# Patient Record
Sex: Male | Born: 1980 | Race: White | Hispanic: No | Marital: Married | Smoking: Never smoker
Health system: Southern US, Community
[De-identification: ages and names within clinical notes are randomized; demographics above are authoritative.]

## PROBLEM LIST (undated history)

## (undated) DIAGNOSIS — J45909 Unspecified asthma, uncomplicated: Secondary | ICD-10-CM

## (undated) DIAGNOSIS — E119 Type 2 diabetes mellitus without complications: Secondary | ICD-10-CM

## (undated) HISTORY — PX: KNEE SURGERY: SHX244

---

## 2001-07-21 ENCOUNTER — Ambulatory Visit (HOSPITAL_BASED_OUTPATIENT_CLINIC_OR_DEPARTMENT_OTHER): Admission: RE | Admit: 2001-07-21 | Discharge: 2001-07-21 | Payer: Self-pay | Admitting: Orthopedic Surgery

## 2002-10-10 ENCOUNTER — Ambulatory Visit (HOSPITAL_COMMUNITY): Admission: RE | Admit: 2002-10-10 | Discharge: 2002-10-10 | Payer: Self-pay | Admitting: *Deleted

## 2002-10-10 ENCOUNTER — Encounter: Payer: Self-pay | Admitting: *Deleted

## 2006-05-04 ENCOUNTER — Emergency Department (HOSPITAL_COMMUNITY): Admission: EM | Admit: 2006-05-04 | Discharge: 2006-05-04 | Payer: Self-pay | Admitting: Emergency Medicine

## 2012-10-17 ENCOUNTER — Encounter (HOSPITAL_COMMUNITY): Payer: Self-pay | Admitting: Emergency Medicine

## 2012-10-17 ENCOUNTER — Observation Stay (HOSPITAL_COMMUNITY)
Admission: EM | Admit: 2012-10-17 | Discharge: 2012-10-19 | Disposition: A | Discharge status: Home / Self Care | Payer: MEDICAID | Attending: Internal Medicine | Admitting: Internal Medicine

## 2012-10-17 ENCOUNTER — Emergency Department (HOSPITAL_COMMUNITY): Payer: Self-pay

## 2012-10-17 DIAGNOSIS — E860 Dehydration: Secondary | ICD-10-CM

## 2012-10-17 DIAGNOSIS — R112 Nausea with vomiting, unspecified: Secondary | ICD-10-CM | POA: Insufficient documentation

## 2012-10-17 DIAGNOSIS — J45909 Unspecified asthma, uncomplicated: Secondary | ICD-10-CM | POA: Insufficient documentation

## 2012-10-17 DIAGNOSIS — R7309 Other abnormal glucose: Secondary | ICD-10-CM

## 2012-10-17 DIAGNOSIS — E1110 Type 2 diabetes mellitus with ketoacidosis without coma: Secondary | ICD-10-CM

## 2012-10-17 DIAGNOSIS — E1100 Type 2 diabetes mellitus with hyperosmolarity without nonketotic hyperglycemic-hyperosmolar coma (NKHHC): Principal | ICD-10-CM | POA: Insufficient documentation

## 2012-10-17 DIAGNOSIS — R1310 Dysphagia, unspecified: Secondary | ICD-10-CM | POA: Insufficient documentation

## 2012-10-17 DIAGNOSIS — R739 Hyperglycemia, unspecified: Secondary | ICD-10-CM | POA: Diagnosis present

## 2012-10-17 DIAGNOSIS — E119 Type 2 diabetes mellitus without complications: Secondary | ICD-10-CM | POA: Diagnosis present

## 2012-10-17 DIAGNOSIS — K760 Fatty (change of) liver, not elsewhere classified: Secondary | ICD-10-CM | POA: Diagnosis present

## 2012-10-17 DIAGNOSIS — R109 Unspecified abdominal pain: Secondary | ICD-10-CM | POA: Diagnosis present

## 2012-10-17 DIAGNOSIS — K7689 Other specified diseases of liver: Secondary | ICD-10-CM | POA: Insufficient documentation

## 2012-10-17 HISTORY — DX: Unspecified asthma, uncomplicated: J45.909

## 2012-10-17 HISTORY — DX: Type 2 diabetes mellitus without complications: E11.9

## 2012-10-17 LAB — CBC WITH DIFFERENTIAL/PLATELET
Basophils Absolute: 0 K/uL (ref 0.0–0.1)
Basophils Relative: 0 % (ref 0–1)
Eosinophils Absolute: 0.2 10*3/uL (ref 0.0–0.7)
Eosinophils Relative: 2 % (ref 0–5)
HCT: 48.9 % (ref 39.0–52.0)
Hemoglobin: 18 g/dL — ABNORMAL HIGH (ref 13.0–17.0)
Lymphocytes Relative: 11 % — ABNORMAL LOW (ref 12–46)
Lymphs Abs: 1.1 10*3/uL (ref 0.7–4.0)
MCH: 32.6 pg (ref 26.0–34.0)
MCHC: 36.8 g/dL — ABNORMAL HIGH (ref 30.0–36.0)
MCV: 88.6 fL (ref 78.0–100.0)
Monocytes Absolute: 0.4 K/uL (ref 0.1–1.0)
Monocytes Relative: 4 % (ref 3–12)
Neutro Abs: 8.1 10*3/uL — ABNORMAL HIGH (ref 1.7–7.7)
Neutrophils Relative %: 82 % — ABNORMAL HIGH (ref 43–77)
Platelets: 176 10*3/uL (ref 150–400)
RBC: 5.52 MIL/uL (ref 4.22–5.81)
RDW: 12.7 % (ref 11.5–15.5)
WBC: 9.9 10*3/uL (ref 4.0–10.5)

## 2012-10-17 LAB — POCT I-STAT 3, VENOUS BLOOD GAS (G3P V)
Acid-base deficit: 5 mmol/L — ABNORMAL HIGH (ref 0.0–2.0)
Bicarbonate: 20.8 meq/L (ref 20.0–24.0)
O2 Saturation: 65 %
Patient temperature: 98
TCO2: 22 mmol/L (ref 0–100)
pCO2, Ven: 40 mmHg — ABNORMAL LOW (ref 45.0–50.0)
pH, Ven: 7.323 — ABNORMAL HIGH (ref 7.250–7.300)
pO2, Ven: 36 mmHg (ref 30.0–45.0)

## 2012-10-17 LAB — BASIC METABOLIC PANEL
CO2: 21 mEq/L (ref 19–32)
Chloride: 100 mEq/L (ref 96–112)
Glucose, Bld: 265 mg/dL — ABNORMAL HIGH (ref 70–99)
Potassium: 4 mEq/L (ref 3.5–5.1)
Sodium: 136 mEq/L (ref 135–145)

## 2012-10-17 LAB — URINALYSIS, ROUTINE W REFLEX MICROSCOPIC
Bilirubin Urine: NEGATIVE
Glucose, UA: >1000 mg/dL — AB
Hgb urine dipstick: NEGATIVE
Ketones, ur: >80 mg/dL — AB
Leukocytes, UA: NEGATIVE
Nitrite: NEGATIVE
Protein, ur: NEGATIVE mg/dL
Specific Gravity, Urine: 1.043 — ABNORMAL HIGH (ref 1.005–1.030)
Urobilinogen, UA: 0.2 mg/dL (ref 0.0–1.0)
pH: 5.5 (ref 5.0–8.0)

## 2012-10-17 LAB — COMPREHENSIVE METABOLIC PANEL
ALT: 79 U/L — ABNORMAL HIGH (ref 0–53)
AST: 35 U/L (ref 0–37)
Albumin: 4.5 g/dL (ref 3.5–5.2)
Alkaline Phosphatase: 145 U/L — ABNORMAL HIGH (ref 39–117)
BUN: 14 mg/dL (ref 6–23)
CO2: 21 mEq/L (ref 19–32)
Calcium: 10 mg/dL (ref 8.4–10.5)
Chloride: 94 mEq/L — ABNORMAL LOW (ref 96–112)
Creatinine, Ser: 0.77 mg/dL (ref 0.50–1.35)
GFR calc Af Amer: >90 mL/min (ref >90)
GFR calc non Af Amer: >90 mL/min (ref >90)
Glucose, Bld: 337 mg/dL — ABNORMAL HIGH (ref 70–99)
Potassium: 4.4 mEq/L (ref 3.5–5.1)
Sodium: 134 mEq/L — ABNORMAL LOW (ref 135–145)
Total Bilirubin: 1.1 mg/dL (ref 0.3–1.2)
Total Protein: 7.5 g/dL (ref 6.0–8.3)

## 2012-10-17 LAB — GLUCOSE, CAPILLARY
Glucose-Capillary: 281 mg/dL — ABNORMAL HIGH (ref 70–99)
Glucose-Capillary: 261 mg/dL — ABNORMAL HIGH (ref 70–99)

## 2012-10-17 LAB — LIPASE, BLOOD: Lipase: 23 U/L (ref 11–59)

## 2012-10-17 LAB — KETONES, QUALITATIVE: Acetone, Bld: NEGATIVE

## 2012-10-17 MED ORDER — SODIUM CHLORIDE 0.9 % IV BOLUS (SEPSIS)
1000.0000 mL | Freq: Once | INTRAVENOUS | Status: AC
  Administered 2012-10-17: 1000 mL via INTRAVENOUS

## 2012-10-17 MED ORDER — SODIUM CHLORIDE 0.9 % IV SOLN
INTRAVENOUS | Status: DC
  Administered 2012-10-17: 2 [IU]/h via INTRAVENOUS
  Filled 2012-10-17: qty 1

## 2012-10-17 MED ORDER — INSULIN ASPART 100 UNIT/ML ~~LOC~~ SOLN
0.0000 [IU] | Freq: Three times a day (TID) | SUBCUTANEOUS | Status: DC
Start: 2012-10-18 — End: 2012-10-17

## 2012-10-17 MED ORDER — SODIUM CHLORIDE 0.9 % IV SOLN: Freq: Once | INTRAVENOUS | Status: DC

## 2012-10-17 MED ORDER — MORPHINE SULFATE 4 MG/ML IJ SOLN
4.0000 mg | Freq: Once | INTRAMUSCULAR | Status: AC
  Administered 2012-10-17: 4 mg via INTRAVENOUS
  Filled 2012-10-17: qty 1

## 2012-10-17 MED ORDER — DEXTROSE-NACL 5-0.45 % IV SOLN: INTRAVENOUS | Status: DC

## 2012-10-17 MED ORDER — INSULIN REGULAR BOLUS VIA INFUSION
0.0000 [IU] | Freq: Three times a day (TID) | INTRAVENOUS | Status: DC
  Filled 2012-10-17: qty 10

## 2012-10-17 MED ORDER — SODIUM CHLORIDE 0.9 % IV SOLN: INTRAVENOUS | Status: DC

## 2012-10-17 MED ORDER — INSULIN GLARGINE 100 UNIT/ML ~~LOC~~ SOLN: 5.0000 [IU] | Freq: Once | SUBCUTANEOUS | Status: DC

## 2012-10-17 MED ORDER — DEXTROSE 50 % IV SOLN: 25.0000 mL | INTRAVENOUS | Status: DC | PRN

## 2012-10-17 MED ORDER — ONDANSETRON 4 MG PO TBDP
4.0000 mg | ORAL_TABLET | Freq: Once | ORAL | Status: AC
  Administered 2012-10-17: 4 mg via ORAL
  Filled 2012-10-17: qty 1

## 2012-10-17 MED ORDER — ONDANSETRON HCL 4 MG/2ML IJ SOLN
4.0000 mg | Freq: Once | INTRAMUSCULAR | Status: AC
  Administered 2012-10-17: 4 mg via INTRAVENOUS
  Filled 2012-10-17: qty 2

## 2012-10-17 MED ORDER — PROMETHAZINE HCL 25 MG/ML IJ SOLN
12.5000 mg | Freq: Once | INTRAMUSCULAR | Status: AC
  Administered 2012-10-17: 12.5 mg via INTRAVENOUS
  Filled 2012-10-17: qty 1

## 2012-10-17 MED ORDER — INSULIN ASPART 100 UNIT/ML ~~LOC~~ SOLN: 6.0000 [IU] | Freq: Once | SUBCUTANEOUS | Status: DC

## 2012-10-17 NOTE — ED Provider Notes (Signed)
History     CSN: [id number]  Arrival date & time 10/17/12  1639   First MD Initiated Contact with Patient 10/17/12 1850      Chief Complaint  Patient presents with  . Nausea  . Emesis  . Abdominal Pain    (Consider location/radiation/quality/duration/timing/severity/associated sxs/prior treatment) HPI Sean Anderson is a 31 y.o. male who presents with complaint of abdominal pain, nausea, persistent vomiting. States symptoms began this morning. Unable to keep anything down. Pain mainly in the upper abdomen but radiates all over. Denies diarrhea. Denies urinary symptoms. Hx of esophageal problems, but this does not feel like it. Denies recent illness, denies alcohol use, no medical problems. States chills, but did not measure temperature. Pain is described as sharp, radiating into the back. Nothing makes it better or worse. States vomited close to 80 times today, mainly yellow color.  Past Medical History  Diagnosis Date  . Asthma     Past Surgical History  Procedure Date  . Knee surgery     No family history on file.  History  Substance Use Topics  . Smoking status: Never Smoker   . Smokeless tobacco: Not on file  . Alcohol Use: Yes      Review of Systems  Constitutional: Positive for chills.  Gastrointestinal: Positive for nausea, vomiting and abdominal pain.  Genitourinary: Negative for dysuria, urgency and flank pain.  Musculoskeletal: Positive for back pain.  Skin: Negative.   Neurological: Positive for dizziness and weakness.  All other systems reviewed and are negative.    Allergies  Banana; Penicillins; and Pineapple  Home Medications  No current outpatient prescriptions on file.  BP 126/83  Pulse 106  Temp 98.9 F (37.2 C) (Oral)  Resp 18  SpO2 96%  Physical Exam  Nursing note and vitals reviewed. Constitutional: He is oriented to person, place, and time. He appears well-developed and well-nourished.       Uncomfortable appearing  HENT:    Head: Normocephalic.       Oral mucosa dry  Eyes: Conjunctivae normal are normal.  Neck: Neck supple.  Cardiovascular: Regular rhythm and normal heart sounds.        tachycardic  Pulmonary/Chest: Effort normal and breath sounds normal. No respiratory distress. He has no wheezes. He has no rales.  Abdominal: Soft. There is tenderness. There is guarding.       Tender diffusely, with most tenderness in RUQ, LUQ, epigastric area  Musculoskeletal: He exhibits no edema.  Neurological: He is alert and oriented to person, place, and time.  Skin: Skin is warm and dry.  Psychiatric: He has a normal mood and affect.    ED Course  Procedures (including critical care time)  Pt with nausea, vomiting, abdominal pain. Labs, fluids. Will monitor. Pain meds and anti emetics ordered.   Results for orders placed during the hospital encounter of 10/17/12  CBC WITH DIFFERENTIAL      Component Value Range   WBC 9.9  4.0 - 10.5 K/uL   RBC 5.52  4.22 - 5.81 MIL/uL   Hemoglobin 18.0 (*) 13.0 - 17.0 g/dL   HCT 40.9  81.1 - 91.4 %   MCV 88.6  78.0 - 100.0 fL   MCH 32.6  26.0 - 34.0 pg   MCHC 36.8 (*) 30.0 - 36.0 g/dL   RDW 78.2  95.6 - 21.3 %   Platelets 176  150 - 400 K/uL   Neutrophils Relative 82 (*) 43 - 77 %   Neutro Abs 8.1 (*)  1.7 - 7.7 K/uL   Lymphocytes Relative 11 (*) 12 - 46 %   Lymphs Abs 1.1  0.7 - 4.0 K/uL   Monocytes Relative 4  3 - 12 %   Monocytes Absolute 0.4  0.1 - 1.0 K/uL   Eosinophils Relative 2  0 - 5 %   Eosinophils Absolute 0.2  0.0 - 0.7 K/uL   Basophils Relative 0  0 - 1 %   Basophils Absolute 0.0  0.0 - 0.1 K/uL  COMPREHENSIVE METABOLIC PANEL      Component Value Range   Sodium 134 (*) 135 - 145 mEq/L   Potassium 4.4  3.5 - 5.1 mEq/L   Chloride 94 (*) 96 - 112 mEq/L   CO2 21  19 - 32 mEq/L   Glucose, Bld 337 (*) 70 - 99 mg/dL   BUN 14  6 - 23 mg/dL   Creatinine, Ser 1.61  0.50 - 1.35 mg/dL   Calcium 09.6  8.4 - 04.5 mg/dL   Total Protein 7.5  6.0 - 8.3 g/dL    Albumin 4.5  3.5 - 5.2 g/dL   AST 35  0 - 37 U/L   ALT 79 (*) 0 - 53 U/L   Alkaline Phosphatase 145 (*) 39 - 117 U/L   Total Bilirubin 1.1  0.3 - 1.2 mg/dL   GFR calc non Af Amer >90  >90 mL/min   GFR calc Af Amer >90  >90 mL/min  LIPASE, BLOOD      Component Value Range   Lipase 23  11 - 59 U/L  URINALYSIS, ROUTINE W REFLEX MICROSCOPIC      Component Value Range   Color, Urine YELLOW  YELLOW   APPearance CLEAR  CLEAR   Specific Gravity, Urine 1.043 (*) 1.005 - 1.030   pH 5.5  5.0 - 8.0   Glucose, UA >1000 (*) NEGATIVE mg/dL   Hgb urine dipstick NEGATIVE  NEGATIVE   Bilirubin Urine NEGATIVE  NEGATIVE   Ketones, ur >80 (*) NEGATIVE mg/dL   Protein, ur NEGATIVE  NEGATIVE mg/dL   Urobilinogen, UA 0.2  0.0 - 1.0 mg/dL   Nitrite NEGATIVE  NEGATIVE   Leukocytes, UA NEGATIVE  NEGATIVE  POCT I-STAT 3, BLOOD GAS (G3P V)      Component Value Range   pH, Ven 7.323 (*) 7.250 - 7.300   pCO2, Ven 40.0 (*) 45.0 - 50.0 mmHg   pO2, Ven 36.0  30.0 - 45.0 mmHg   Bicarbonate 20.8  20.0 - 24.0 mEq/L   TCO2 22  0 - 100 mmol/L   O2 Saturation 65.0     Acid-base deficit 5.0 (*) 0.0 - 2.0 mmol/L   Patient temperature 98.0 F     Sample type VENOUS     Comment NOTIFIED PHYSICIAN    GLUCOSE, CAPILLARY      Component Value Range   Glucose-Capillary 281 (*) 70 - 99 mg/dL  URINE MICROSCOPIC-ADD ON      Component Value Range   Squamous Epithelial / LPF RARE  RARE   WBC, UA 0-2  <3 WBC/hpf   RBC / HPF 0-2  <3 RBC/hpf   Bacteria, UA RARE  RARE  BASIC METABOLIC PANEL      Component Value Range   Sodium 136  135 - 145 mEq/L   Potassium 4.0  3.5 - 5.1 mEq/L   Chloride 100  96 - 112 mEq/L   CO2 21  19 - 32 mEq/L   Glucose, Bld 265 (*) 70 - 99  mg/dL   BUN 13  6 - 23 mg/dL   Creatinine, Ser 2.35  0.50 - 1.35 mg/dL   Calcium 8.3 (*) 8.4 - 10.5 mg/dL   GFR calc non Af Amer >90  >90 mL/min   GFR calc Af Amer >90  >90 mL/min  KETONES, QUALITATIVE      Component Value Range   Acetone, Bld NEGATIVE   NEGATIVE  GLUCOSE, CAPILLARY      Component Value Range   Glucose-Capillary 261 (*) 70 - 99 mg/dL  TROPONIN I      Component Value Range   Troponin I <0.30  <0.30 ng/mL  CK      Component Value Range   Total CK 25  7 - 232 U/L   Dg Abd Acute W/chest  10/17/2012  *RADIOLOGY REPORT*  Clinical Data: Abdominal pain, vomiting.  ACUTE ABDOMEN SERIES (ABDOMEN 2 VIEW & CHEST 1 VIEW)  Comparison: None.  Findings: Lungs are clear.  Cardiomediastinal contours are within normal range.  No free intraperitoneal air. The bowel gas pattern is non- obstructive. Organ outlines are normal where seen. No acute or aggressive osseous abnormality identified.  IMPRESSION: Nonobstructive bowel gas pattern.   Original Report Authenticated By: Jearld Lesch, M.D.     Filed Vitals:   10/18/12 0010  BP: 106/67  Pulse: 90  Temp: 98.5 F (36.9 C)  Resp: 20     1. DKA (diabetic ketoacidoses)   2. Dehydration   3. Nausea & vomiting   4. Abdominal pain   5. Hyperglycemia       MDM  Pt with new elevation on glucose, possible diabetes. Lipase negative. CBC unremarkable. Pt feels better with fluids, anti emetics. UA shows >1000 glucose. Anion gap 19, however pH elevated. Possible DKA? Pt still appears sick, will admit. Discussed with [facility], who will admit pt, will start on glucose stabilizer.   Filed Vitals:   10/18/12 0010  BP: 106/67  Pulse: 90  Temp: 98.5 F (36.9 C)  Resp: [address] Zeyna Mkrtchyan, [place] 10/18/12 0114

## 2012-10-17 NOTE — ED Notes (Signed)
Pt c/o upper abdominal pain with n/v onset today. Pt reports that he vomited 60-80 times today. Pt denies change in diet.

## 2012-10-17 NOTE — ED Provider Notes (Addendum)
Medical screening examination/treatment/procedure(s) were conducted as a shared visit with non-physician practitioner(s) and myself.  I personally evaluated the patient during the encounter   Pt with new onset DM, glucose over 300 initially, anion gap of 19, ketones in UA.  Pt does endorse polyuria, polydypsia ongoing. Numerous episodes of vomiting today.  Father reports pt's aunt has DM, but no other close relatives.  Pt likely in early DKA, will continue aggressive IVF's, IV insulin, will need admit to step down most likely.    CRITICAL CARE Performed by: Lear Ng   Total critical care time: 30 min  Critical care time was exclusive of separately billable procedures and treating other patients.  Critical care was necessary to treat or prevent imminent or life-threatening deterioration.  Critical care was time spent personally by me on the following activities: development of treatment plan with patient and/or surrogate as well as nursing, discussions with consultants, evaluation of patient's response to treatment, examination of patient, obtaining history from patient or surrogate, ordering and performing treatments and interventions, ordering and review of laboratory studies, ordering and review of radiographic studies, pulse oximetry and re-evaluation of patient's condition.    Impression: DKA, dehydration    Gavin Pound. Oletta Lamas, MD 10/17/12 2258  Gavin Pound. Oletta Lamas, MD 10/17/12 2259

## 2012-10-17 NOTE — ED Notes (Signed)
Patient reports having extreme thirst and frequent urination for the last few weeks. Pt states he is concerned about diabetes. Patient remains alertx4, NAD.

## 2012-10-17 NOTE — ED Notes (Signed)
Pt denies nausea.

## 2012-10-17 NOTE — ED Notes (Signed)
Please contact pt. father Davell at cell [phone]. Or wife Lora Havens at cell [phone] if any changes.

## 2012-10-18 ENCOUNTER — Observation Stay (HOSPITAL_COMMUNITY): Payer: Self-pay

## 2012-10-18 ENCOUNTER — Encounter (HOSPITAL_COMMUNITY): Payer: Self-pay | Admitting: Radiology

## 2012-10-18 LAB — RAPID URINE DRUG SCREEN, HOSP PERFORMED
Amphetamines: NOT DETECTED
Benzodiazepines: NOT DETECTED
Opiates: POSITIVE — AB

## 2012-10-18 LAB — CBC
MCH: 32.6 pg (ref 26.0–34.0)
MCHC: 36.1 g/dL — ABNORMAL HIGH (ref 30.0–36.0)
Platelets: 142 10*3/uL — ABNORMAL LOW (ref 150–400)

## 2012-10-18 LAB — BASIC METABOLIC PANEL
BUN: 13 mg/dL (ref 6–23)
Calcium: 8.2 mg/dL — ABNORMAL LOW (ref 8.4–10.5)
Creatinine, Ser: 0.68 mg/dL (ref 0.50–1.35)
GFR calc non Af Amer: >90 mL/min (ref >90)
Glucose, Bld: 239 mg/dL — ABNORMAL HIGH (ref 70–99)

## 2012-10-18 LAB — HEMOGLOBIN A1C
Hgb A1c MFr Bld: 12.4 % — ABNORMAL HIGH (ref <5.7)
Mean Plasma Glucose: 312 mg/dL — ABNORMAL HIGH (ref <117)
Mean Plasma Glucose: 309 mg/dL — ABNORMAL HIGH (ref <117)

## 2012-10-18 LAB — CBC WITH DIFFERENTIAL/PLATELET
Basophils Relative: 0 % (ref 0–1)
HCT: 41.8 % (ref 39.0–52.0)
Hemoglobin: 14.7 g/dL (ref 13.0–17.0)
Lymphs Abs: 1.5 10*3/uL (ref 0.7–4.0)
MCH: 31.8 pg (ref 26.0–34.0)
MCHC: 35.2 g/dL (ref 30.0–36.0)
Monocytes Absolute: 0.4 10*3/uL (ref 0.1–1.0)
Monocytes Relative: 8 % (ref 3–12)
Neutro Abs: 3.4 10*3/uL (ref 1.7–7.7)
RBC: 4.62 MIL/uL (ref 4.22–5.81)

## 2012-10-18 LAB — COMPREHENSIVE METABOLIC PANEL
ALT: 51 U/L (ref 0–53)
AST: 20 U/L (ref 0–37)
Calcium: 8.4 mg/dL (ref 8.4–10.5)
GFR calc Af Amer: >90 mL/min (ref >90)
Sodium: 139 mEq/L (ref 135–145)
Total Protein: 6 g/dL (ref 6.0–8.3)

## 2012-10-18 LAB — GLUCOSE, CAPILLARY
Glucose-Capillary: 240 mg/dL — ABNORMAL HIGH (ref 70–99)
Glucose-Capillary: 207 mg/dL — ABNORMAL HIGH (ref 70–99)
Glucose-Capillary: 147 mg/dL — ABNORMAL HIGH (ref 70–99)
Glucose-Capillary: 129 mg/dL — ABNORMAL HIGH (ref 70–99)
Glucose-Capillary: 121 mg/dL — ABNORMAL HIGH (ref 70–99)
Glucose-Capillary: 174 mg/dL — ABNORMAL HIGH (ref 70–99)
Glucose-Capillary: 156 mg/dL — ABNORMAL HIGH (ref 70–99)
Glucose-Capillary: 158 mg/dL — ABNORMAL HIGH (ref 70–99)
Glucose-Capillary: 271 mg/dL — ABNORMAL HIGH (ref 70–99)
Glucose-Capillary: 209 mg/dL — ABNORMAL HIGH (ref 70–99)

## 2012-10-18 LAB — TROPONIN I
Troponin I: <0.3 ng/mL (ref <0.30)
Troponin I: <0.3 ng/mL (ref <0.30)

## 2012-10-18 MED ORDER — ACETAMINOPHEN 325 MG PO TABS
650.0000 mg | ORAL_TABLET | Freq: Four times a day (QID) | ORAL | Status: DC | PRN
  Administered 2012-10-18: 650 mg via ORAL
  Filled 2012-10-18: qty 2

## 2012-10-18 MED ORDER — ONDANSETRON HCL 4 MG/2ML IJ SOLN: 4.0000 mg | Freq: Four times a day (QID) | INTRAMUSCULAR | Status: DC | PRN

## 2012-10-18 MED ORDER — SODIUM CHLORIDE 0.9 % IV SOLN: INTRAVENOUS | Status: DC

## 2012-10-18 MED ORDER — IOHEXOL 300 MG/ML  SOLN
100.0000 mL | Freq: Once | INTRAMUSCULAR | Status: AC | PRN
  Administered 2012-10-18: 100 mL via INTRAVENOUS

## 2012-10-18 MED ORDER — PANTOPRAZOLE SODIUM 40 MG IV SOLR
40.0000 mg | INTRAVENOUS | Status: DC
  Administered 2012-10-18: 40 mg via INTRAVENOUS
  Filled 2012-10-18 (×2): qty 40

## 2012-10-18 MED ORDER — PANTOPRAZOLE SODIUM 40 MG PO TBEC
40.0000 mg | DELAYED_RELEASE_TABLET | Freq: Every day | ORAL | Status: DC
  Administered 2012-10-18: 40 mg via ORAL
  Filled 2012-10-18: qty 1

## 2012-10-18 MED ORDER — SODIUM CHLORIDE 0.9 % IJ SOLN: 3.0000 mL | Freq: Two times a day (BID) | INTRAMUSCULAR | Status: DC

## 2012-10-18 MED ORDER — INSULIN GLARGINE 100 UNIT/ML ~~LOC~~ SOLN
10.0000 [IU] | Freq: Once | SUBCUTANEOUS | Status: AC
  Administered 2012-10-18: 10 [IU] via SUBCUTANEOUS

## 2012-10-18 MED ORDER — ONDANSETRON HCL 4 MG PO TABS: 4.0000 mg | ORAL_TABLET | Freq: Four times a day (QID) | ORAL | Status: DC | PRN

## 2012-10-18 MED ORDER — DEXTROSE 50 % IV SOLN: 25.0000 mL | INTRAVENOUS | Status: DC | PRN

## 2012-10-18 MED ORDER — INSULIN ASPART PROT & ASPART (70-30 MIX) 100 UNIT/ML ~~LOC~~ SUSP
25.0000 [IU] | Freq: Two times a day (BID) | SUBCUTANEOUS | Status: DC
  Administered 2012-10-18 – 2012-10-19 (×2): 25 [IU] via SUBCUTANEOUS
  Filled 2012-10-18: qty 3

## 2012-10-18 MED ORDER — POTASSIUM CHLORIDE 10 MEQ/100ML IV SOLN
10.0000 meq | INTRAVENOUS | Status: AC
  Administered 2012-10-18 (×3): 10 meq via INTRAVENOUS
  Filled 2012-10-18 (×3): qty 100

## 2012-10-18 MED ORDER — IOHEXOL 300 MG/ML  SOLN
20.0000 mL | INTRAMUSCULAR | Status: AC
  Administered 2012-10-18 (×2): 20 mL via ORAL

## 2012-10-18 MED ORDER — SODIUM CHLORIDE 0.9 % IV SOLN
INTRAVENOUS | Status: DC
  Administered 2012-10-18 – 2012-10-19 (×3): via INTRAVENOUS
  Filled 2012-10-18 (×3): qty 1000

## 2012-10-18 MED ORDER — LIVING WELL WITH DIABETES BOOK
Freq: Once | Status: AC
  Administered 2012-10-18: 13:00:00
  Filled 2012-10-18 (×3): qty 1

## 2012-10-18 MED ORDER — SODIUM CHLORIDE 0.9 % IV SOLN
INTRAVENOUS | Status: DC
  Administered 2012-10-18: 10:00:00 via INTRAVENOUS
  Filled 2012-10-18 (×2): qty 1000

## 2012-10-18 MED ORDER — POTASSIUM CHLORIDE 10 MEQ/100ML IV SOLN: 10.0000 meq | INTRAVENOUS | Status: DC

## 2012-10-18 MED ORDER — ONDANSETRON HCL 4 MG/2ML IJ SOLN: 4.0000 mg | Freq: Three times a day (TID) | INTRAMUSCULAR | Status: DC | PRN

## 2012-10-18 MED ORDER — INSULIN ASPART 100 UNIT/ML ~~LOC~~ SOLN
0.0000 [IU] | Freq: Three times a day (TID) | SUBCUTANEOUS | Status: DC
  Administered 2012-10-18: 8 [IU] via SUBCUTANEOUS

## 2012-10-18 MED ORDER — SODIUM CHLORIDE 0.9 % IV SOLN
INTRAVENOUS | Status: DC
  Filled 2012-10-18 (×2): qty 1000

## 2012-10-18 MED ORDER — DEXTROSE-NACL 5-0.45 % IV SOLN
INTRAVENOUS | Status: DC
  Administered 2012-10-18: via INTRAVENOUS

## 2012-10-18 MED ORDER — SODIUM CHLORIDE 0.9 % IV SOLN
INTRAVENOUS | Status: AC
  Filled 2012-10-18: qty 1

## 2012-10-18 MED ORDER — ACETAMINOPHEN 650 MG RE SUPP: 650.0000 mg | Freq: Four times a day (QID) | RECTAL | Status: DC | PRN

## 2012-10-18 NOTE — Progress Notes (Signed)
Inpatient Diabetes Program Recommendations  AACE/ADA: New Consensus Statement on Inpatient Glycemic Control (2013)  Target Ranges:  Prepandial:   less than 140 mg/dL      Peak postprandial:   less than 180 mg/dL (1-2 hours)      Critically ill patients:  140 - 180 mg/dL   Reason for Visit: Consult for new onset of diabetes and medication recommendation.  Note: Spoke with pt about new diagnosis. Discussed what an A1C is, basic pathophysiology of DM Type 2, basic home care, meal planning, importance of checking CBGs and maintaining good CBG control to prevent long-term and short-term complications. Reviewed signs and symptoms of hyperglycemia and hypoglycemia and treatment of both. Patient states that he will watch the videos when his wife comes this afternoon.  Patient does not have any insurance nor a primary care physician.  Since he lives in [place] I provided him with [facility] phone number  [phone]) and address.  He can be seen at the [facility] since he is uninsured and they will assist him with medication needs also once he is seen. Provided information on the glucose meter at [employer] ([employer]) which is more affordable than some other meters.   RNs to provide ongoing basic DM education at bedside with this patient. Have ordered educational booklet, insulin starter kit, and DM videos.    Patient's A1C is 12.5%.  Therefore, recommend that he goes home on Novolin/Relion 70/30 25 units BID.   Will continue to follow. Thanks, Orlando Penner, RN, BSN, CCRN Diabetes Coordinator Inpatient Diabetes Program [phone]

## 2012-10-18 NOTE — Progress Notes (Signed)
Nutrition Brief Note  Patient identified on the Malnutrition Screening Tool (MST) Report. Pt unable to state usual weight. Endorses slight weight loss but unable to state how much or time frame. Pt's intake at baseline is minimal per his report.  Body mass index is 26.67 kg/(m^2). Pt meets criteria for weight is WNL based on current BMI.   Current diet order is Carbohydrate Modified Medium; meal intake is not recorded at this time. Pt reports intake is improving. Labs and medications reviewed.   No nutrition interventions warranted at this time. If nutrition issues arise, please consult RD.   Jarold Motto MS, RD, LDN Pager: [phone] After-hours pager: [phone]

## 2012-10-18 NOTE — H&P (Signed)
Sean Anderson is an 31 y.o. male.   Patient was seen and examined on October 17, 2012. PCP - none. Chief Complaint: Nausea vomiting abdominal pain. HPI: 31 year old male with no significant past medical history has been experiencing some polyuria and polydipsia for last one month. Since today morning patient has been having multiple episodes of nausea vomiting and abdominal discomfort. Patient's abdominal discomfort has been diffuse. Since he was unable to keep anything in his vomiting was persistent he came to the ER. In the ER patient was found to have hyperglycemia with mildly elevated anion gap. Patient has been admitted for further management. Acute abdominal series was unremarkable. Patient states that he's having having some retrosternal heartburn off and on for last few months. Denies any exertional symptoms or shortness of breath.  Past Medical History  Diagnosis Date  . Asthma     Past Surgical History  Procedure Date  . Knee surgery     Family History  Problem Relation Age of Onset  . Colon cancer Other    Social History:  reports that he has never smoked. He does not have any smokeless tobacco history on file. He reports that he drinks alcohol. He reports that he does not use illicit drugs.  Allergies:  Allergies  Allergen Reactions  . Banana   . Penicillins Hives  . Pineapple      (Not in a hospital admission)  Results for orders placed during the hospital encounter of 10/17/12 (from the past 48 hour(s))  CBC WITH DIFFERENTIAL     Status: Abnormal   Collection Time   10/17/12  5:40 PM      Component Value Range Comment   WBC 9.9  4.0 - 10.5 K/uL    RBC 5.52  4.22 - 5.81 MIL/uL    Hemoglobin 18.0 (*) 13.0 - 17.0 g/dL    HCT 04.5  40.9 - 81.1 %    MCV 88.6  78.0 - 100.0 fL    MCH 32.6  26.0 - 34.0 pg    MCHC 36.8 (*) 30.0 - 36.0 g/dL    RDW 91.4  78.2 - 95.6 %    Platelets 176  150 - 400 K/uL    Neutrophils Relative 82 (*) 43 - 77 %    Neutro Abs 8.1 (*) 1.7  - 7.7 K/uL    Lymphocytes Relative 11 (*) 12 - 46 %    Lymphs Abs 1.1  0.7 - 4.0 K/uL    Monocytes Relative 4  3 - 12 %    Monocytes Absolute 0.4  0.1 - 1.0 K/uL    Eosinophils Relative 2  0 - 5 %    Eosinophils Absolute 0.2  0.0 - 0.7 K/uL    Basophils Relative 0  0 - 1 %    Basophils Absolute 0.0  0.0 - 0.1 K/uL   COMPREHENSIVE METABOLIC PANEL     Status: Abnormal   Collection Time   10/17/12  5:40 PM      Component Value Range Comment   Sodium 134 (*) 135 - 145 mEq/L    Potassium 4.4  3.5 - 5.1 mEq/L    Chloride 94 (*) 96 - 112 mEq/L    CO2 21  19 - 32 mEq/L    Glucose, Bld 337 (*) 70 - 99 mg/dL    BUN 14  6 - 23 mg/dL    Creatinine, Ser 2.13  0.50 - 1.35 mg/dL    Calcium 08.6  8.4 - 10.5 mg/dL  Total Protein 7.5  6.0 - 8.3 g/dL    Albumin 4.5  3.5 - 5.2 g/dL    AST 35  0 - 37 U/L    ALT 79 (*) 0 - 53 U/L    Alkaline Phosphatase 145 (*) 39 - 117 U/L    Total Bilirubin 1.1  0.3 - 1.2 mg/dL    GFR calc non Af Amer >90  >90 mL/min    GFR calc Af Amer >90  >90 mL/min   LIPASE, BLOOD     Status: Normal   Collection Time   10/17/12  5:40 PM      Component Value Range Comment   Lipase 23  11 - 59 U/L   POCT I-STAT 3, BLOOD GAS (G3P V)     Status: Abnormal   Collection Time   10/17/12  8:44 PM      Component Value Range Comment   pH, Ven 7.323 (*) 7.250 - 7.300    pCO2, Ven 40.0 (*) 45.0 - 50.0 mmHg    pO2, Ven 36.0  30.0 - 45.0 mmHg    Bicarbonate 20.8  20.0 - 24.0 mEq/L    TCO2 22  0 - 100 mmol/L    O2 Saturation 65.0      Acid-base deficit 5.0 (*) 0.0 - 2.0 mmol/L    Patient temperature 98.0 F      Sample type VENOUS      Comment NOTIFIED PHYSICIAN     URINALYSIS, ROUTINE W REFLEX MICROSCOPIC     Status: Abnormal   Collection Time   10/17/12  9:32 PM      Component Value Range Comment   Color, Urine YELLOW  YELLOW    APPearance CLEAR  CLEAR    Specific Gravity, Urine 1.043 (*) 1.005 - 1.030    pH 5.5  5.0 - 8.0    Glucose, UA >1000 (*) NEGATIVE mg/dL    Hgb  urine dipstick NEGATIVE  NEGATIVE    Bilirubin Urine NEGATIVE  NEGATIVE    Ketones, ur >80 (*) NEGATIVE mg/dL    Protein, ur NEGATIVE  NEGATIVE mg/dL    Urobilinogen, UA 0.2  0.0 - 1.0 mg/dL    Nitrite NEGATIVE  NEGATIVE    Leukocytes, UA NEGATIVE  NEGATIVE   URINE MICROSCOPIC-ADD ON     Status: Normal   Collection Time   10/17/12  9:32 PM      Component Value Range Comment   Squamous Epithelial / LPF RARE  RARE    WBC, UA 0-2  <3 WBC/hpf    RBC / HPF 0-2  <3 RBC/hpf    Bacteria, UA RARE  RARE   GLUCOSE, CAPILLARY     Status: Abnormal   Collection Time   10/17/12  9:35 PM      Component Value Range Comment   Glucose-Capillary 281 (*) 70 - 99 mg/dL   BASIC METABOLIC PANEL     Status: Abnormal   Collection Time   10/17/12 10:49 PM      Component Value Range Comment   Sodium 136  135 - 145 mEq/L    Potassium 4.0  3.5 - 5.1 mEq/L    Chloride 100  96 - 112 mEq/L    CO2 21  19 - 32 mEq/L    Glucose, Bld 265 (*) 70 - 99 mg/dL    BUN 13  6 - 23 mg/dL    Creatinine, Ser 1.61  0.50 - 1.35 mg/dL    Calcium 8.3 (*) 8.4 - 10.5  mg/dL    GFR calc non Af Amer >90  >90 mL/min    GFR calc Af Amer >90  >90 mL/min   KETONES, QUALITATIVE     Status: Normal   Collection Time   10/17/12 10:49 PM      Component Value Range Comment   Acetone, Bld NEGATIVE  NEGATIVE   GLUCOSE, CAPILLARY     Status: Abnormal   Collection Time   10/17/12 11:05 PM      Component Value Range Comment   Glucose-Capillary 261 (*) 70 - 99 mg/dL   TROPONIN I     Status: Normal   Collection Time   10/17/12 11:06 PM      Component Value Range Comment   Troponin I <0.30  <0.30 ng/mL   CK     Status: Normal   Collection Time   10/17/12 11:06 PM      Component Value Range Comment   Total CK 25  7 - 232 U/L    Dg Abd Acute W/chest  10/17/2012  *RADIOLOGY REPORT*  Clinical Data: Abdominal pain, vomiting.  ACUTE ABDOMEN SERIES (ABDOMEN 2 VIEW & CHEST 1 VIEW)  Comparison: None.  Findings: Lungs are clear.   Cardiomediastinal contours are within normal range.  No free intraperitoneal air. The bowel gas pattern is non- obstructive. Organ outlines are normal where seen. No acute or aggressive osseous abnormality identified.  IMPRESSION: Nonobstructive bowel gas pattern.   Original Report Authenticated By: Jearld Lesch, M.D.     Review of Systems  Constitutional: Negative.   HENT: Negative.   Eyes: Negative.   Respiratory: Negative.   Cardiovascular: Negative.   Gastrointestinal: Positive for nausea, vomiting and abdominal pain.  Genitourinary: Negative.   Musculoskeletal: Negative.   Skin: Negative.   Neurological: Negative.   Endo/Heme/Allergies: Negative.   Psychiatric/Behavioral: Negative.     Blood pressure 101/82, pulse 96, temperature 98.4 F (36.9 C), temperature source Oral, resp. rate 18, SpO2 96.00%. Physical Exam  Constitutional: He is oriented to person, place, and time. He appears well-developed and well-nourished. No distress.  HENT:  Head: Normocephalic and atraumatic.  Right Ear: External ear normal.  Left Ear: External ear normal.  Nose: Nose normal.  Mouth/Throat: Oropharynx is clear and moist. No oropharyngeal exudate.  Eyes: Conjunctivae normal are normal. Pupils are equal, round, and reactive to light. Right eye exhibits no discharge. Left eye exhibits no discharge. No scleral icterus.  Neck: Normal range of motion. Neck supple.  Cardiovascular: Normal rate and regular rhythm.   Respiratory: Effort normal and breath sounds normal. No respiratory distress. He has no wheezes. He has no rales.  GI: Soft. Bowel sounds are normal. He exhibits no distension. There is no tenderness. There is no rebound.  Musculoskeletal: He exhibits no edema and no tenderness.  Neurological: He is alert and oriented to person, place, and time.       Moves all extremities.  Skin: Skin is warm and dry. He is not diaphoretic.     Assessment/Plan #1. Hyperglycemia with mildly elevated  anion gap - patient probably has early DKA. Due to the patient's mild and Patient was started on DKA protocol with IV insulin which we will continue until his anion gap is corrected after which transitioned to long-acting subcutaneous insulin. Check hemoglobin A1c and continue aggressive hydration. #2. Nausea vomiting abdominal pain - most likely from #1 reason. But since the pain is a persistent but since pain is still persistent we'll get a CT abdomen and pelvis. #3.  Heartburn and dysphagia - patient states he's been having heartburn for last few months with occasional difficulties of swallowing. Keep patient on Protonix. Patient otherwise he will need GI followup. Check the EKG and cardiac enzymes. CODE STATUS - full code.  Corneluis Allston N. 10/18/2012, 12:00 AM

## 2012-10-18 NOTE — Plan of Care (Signed)
Problem: Food- and Nutrition-Related Knowledge Deficit (NB-1.1) Goal: Nutrition education Formal process to instruct or train a patient/client in a skill or to impart knowledge to help patients/clients voluntarily manage or modify food choices and eating behavior to maintain or improve health.  Outcome: Completed/Met Date Met:  10/18/12  RD consulted for nutrition education regarding diabetes.     Lab Results  Component Value Date    HGBA1C 12.4* 10/18/2012    RD provided "Carbohydrate Counting for People with Diabetes" handout from the Academy of [employer] Dietetics. Discussed different food groups and their effects on blood sugar, emphasizing carbohydrate-containing foods. Provided list of carbohydrates and recommended serving sizes of common foods.  Discussed importance of controlled and consistent carbohydrate intake throughout the day. Provided examples of ways to balance meals/snacks and encouraged intake of high-fiber, whole grain complex carbohydrates.  Expect poor to fair compliance.  Current diet order is Carbohydrate Modified Medium. Labs and medications reviewed. No further nutrition interventions warranted at this time. RD contact information provided. If additional nutrition issues arise, please re-consult RD.  Jarold Motto MS, RD, LDN Pager: [phone] After-hours pager: [phone]

## 2012-10-18 NOTE — Progress Notes (Signed)
[facility] PROGRESS NOTE  Sean Anderson NFA:[id number] DOB: 08-28-1981 DOA: 10/17/2012 PCP: Sheila Oats, MD  Assessment/Plan:  #1. Hyperglycemia.  Anion gap closed.  Transitioning off insulin drip onto Lantus and Novolog today.  Need plan for outpatient medications and care (no pcp).  Diabetic education started.  Diabetic coordinator and nutrition consults requested.  #2. Nausea vomiting abdominal pain - most likely from #1 reason.  Nausea / vomiting improved this morning.  Starting carb modified diet.  CT abdomen pelvis pending.  #3. Heartburn and dysphagia - patient states he's been having heartburn for last few months with occasional difficulties of swallowing. Keep patient on Protonix. Patient otherwise he will need GI followup.    Code Status: full Family Communication:  Disposition Plan: To home 10/19/2013  HPI/Subjective: Patient feeling improved.  No further vomiting overnight.    Objective: Filed Vitals:   10/17/12 2013 10/18/12 0010 10/18/12 0211 10/18/12 0530  BP: 101/82 106/67 100/58 99/59  Pulse: 96 90 76 86  Temp: 98.4 F (36.9 C) 98.5 F (36.9 C) 97.8 F (36.6 C) 99.2 F (37.3 C)  TempSrc:  Oral Oral Oral  Resp:  20 18 20   Height:  5\' 10"  (1.778 m)    Weight:  84.3 kg (185 lb 13.6 oz)    SpO2: 96% 97% 95% 93%    Intake/Output Summary (Last 24 hours) at 10/18/12 1026 Last data filed at 10/18/12 0705  Gross per 24 hour  Intake 1082.3 ml  Output    400 ml  Net  682.3 ml   Filed Weights   10/18/12 0010  Weight: 84.3 kg (185 lb 13.6 oz)    Exam:   General:  A&O non toxic  Cardiovascular: Regular rate and rhythm, no murmurs rubs or gallops.  Respiratory: CTA no W/C/R  Abdomen: Soft , NT, ND, +BS  Extremities:  No LEE.  Psych:  A&O, NAD, Cooperative, Well Groomed.  Data Reviewed: Basic Metabolic Panel:  Lab 10/18/12 4696 10/18/12 0148 10/17/12 2249 10/17/12 1740  NA 139 139 136 134*  K 3.6 3.2* 4.0 4.4  CL 105 103 100 94*    CO2 24 23 21 21   GLUCOSE 136* 239* 265* 337*  BUN 12 13 13 14   CREATININE 0.71 0.68 0.67 0.77  CALCIUM 8.4 8.2* 8.3* 10.0  MG -- -- -- --  PHOS -- -- -- --   Liver Function Tests:  Lab 10/18/12 0712 10/17/12 1740  AST 20 35  ALT 51 79*  ALKPHOS 103 145*  BILITOT 0.8 1.1  PROT 6.0 7.5  ALBUMIN 3.4* 4.5    Lab 10/17/12 1740  LIPASE 23  AMYLASE --   CBC:  Lab 10/18/12 0712 10/18/12 0147 10/17/12 1740  WBC 5.5 6.1 9.9  NEUTROABS 3.4 -- 8.1*  HGB 14.7 14.7 18.0*  HCT 41.8 40.7 48.9  MCV 90.5 90.2 88.6  PLT 152 142* 176   Cardiac Enzymes:  Lab 10/18/12 0712 10/18/12 0154 10/17/12 2306  CKTOTAL -- -- 25  CKMB -- -- --  CKMBINDEX -- -- --  TROPONINI <0.30 <0.30 <0.30   CBG:  Lab 10/18/12 1006 10/18/12 0907 10/18/12 0806 10/18/12 0655 10/18/12 0608  GLUCAP 156* 174* 158* 121* 129*      Studies: Dg Abd Acute W/chest  10/17/2012  *RADIOLOGY REPORT*  Clinical Data: Abdominal pain, vomiting.  ACUTE ABDOMEN SERIES (ABDOMEN 2 VIEW & CHEST 1 VIEW)  Comparison: None.  Findings: Lungs are clear.  Cardiomediastinal contours are within normal range.  No free intraperitoneal air. The bowel gas  pattern is non- obstructive. Organ outlines are normal where seen. No acute or aggressive osseous abnormality identified.  IMPRESSION: Nonobstructive bowel gas pattern.   Original Report Authenticated By: Jearld Lesch, M.D.     Scheduled Meds:   . insulin aspart  0-15 Units Subcutaneous TID WC  . [COMPLETED] insulin glargine  10 Units Subcutaneous Once  . [COMPLETED] iohexol  20 mL Oral Q1 Hr x 2  . [COMPLETED] morphine  4 mg Intravenous Once  . [COMPLETED] ondansetron  4 mg Intravenous Once  . [COMPLETED] ondansetron  4 mg Oral Once  . pantoprazole  40 mg Oral QHS  . [COMPLETED] potassium chloride  10 mEq Intravenous Q1 Hr x 3  . [COMPLETED] promethazine  12.5 mg Intravenous Once  . [COMPLETED] sodium chloride  1,000 mL Intravenous Once  . [COMPLETED] sodium chloride  1,000  mL Intravenous Once  . [COMPLETED] sodium chloride  1,000 mL Intravenous Once  . sodium chloride  3 mL Intravenous Q12H  . [COMPLETED] sodium chloride   Intravenous Once  . [DISCONTINUED] insulin aspart  0-9 Units Subcutaneous TID WC  . [DISCONTINUED] insulin aspart  6 Units Intravenous Once  . [DISCONTINUED] insulin glargine  5 Units Subcutaneous Once  . [DISCONTINUED] insulin regular  0-10 Units Intravenous TID WC  . [DISCONTINUED] pantoprazole (PROTONIX) IV  40 mg Intravenous Q24H  . [DISCONTINUED] potassium chloride  10 mEq Intravenous Q1H   Continuous Infusions:   . insulin (NOVOLIN-R) infusion 4.6 Units/hr (10/18/12 0929)  . sodium chloride 0.9 % 1,000 mL with potassium chloride 40 mEq infusion 125 mL/hr at 10/18/12 1000  . [DISCONTINUED] sodium chloride    . [DISCONTINUED] sodium chloride    . [DISCONTINUED] sodium chloride Stopped (10/18/12 0005)  . [DISCONTINUED] dextrose 5 % and 0.45% NaCl    . [DISCONTINUED] dextrose 5 % and 0.45% NaCl 125 mL/hr at 10/18/12 0504  . [DISCONTINUED] insulin (NOVOLIN-R) infusion 2 Units/hr (10/17/12 2317)  . [DISCONTINUED] sodium chloride 0.9 % 1,000 mL with potassium chloride 40 mEq infusion 125 mL/hr at 10/18/12 9604    Principal Problem:  *Nausea and vomiting Active Problems:  Abdominal pain  Hyperglycemia    Time spent: 30 min.    Conley Canal [facility] Pager [phone]. If 8PM-8AM, please contact night-coverage at [email or web address], password [facility] 10/18/2012, 10:26 AM  LOS: 1 day

## 2012-10-18 NOTE — Progress Notes (Signed)
Addendum  Patient seen and examined, chart and data base reviewed.  I agree with the above assessment and plan.  For full details please see Mrs. Algis Downs PA. Note.  HHS, N/V and hepatic steatosis.   Clint Lipps, MD [facility] Pager: [phone] 10/18/2012, 2:06 PM

## 2012-10-19 DIAGNOSIS — K7689 Other specified diseases of liver: Secondary | ICD-10-CM

## 2012-10-19 DIAGNOSIS — E119 Type 2 diabetes mellitus without complications: Secondary | ICD-10-CM | POA: Diagnosis present

## 2012-10-19 DIAGNOSIS — K760 Fatty (change of) liver, not elsewhere classified: Secondary | ICD-10-CM | POA: Diagnosis present

## 2012-10-19 DIAGNOSIS — E1165 Type 2 diabetes mellitus with hyperglycemia: Secondary | ICD-10-CM | POA: Diagnosis present

## 2012-10-19 DIAGNOSIS — E1100 Type 2 diabetes mellitus with hyperosmolarity without nonketotic hyperglycemic-hyperosmolar coma (NKHHC): Principal | ICD-10-CM

## 2012-10-19 LAB — BASIC METABOLIC PANEL
GFR calc Af Amer: >90 mL/min (ref >90)
GFR calc non Af Amer: >90 mL/min (ref >90)
Potassium: 4 mEq/L (ref 3.5–5.1)
Sodium: 139 mEq/L (ref 135–145)

## 2012-10-19 MED ORDER — INSULIN NPH ISOPHANE & REGULAR (70-30) 100 UNIT/ML ~~LOC~~ SUSP: 27.0000 [IU] | Freq: Two times a day (BID) | SUBCUTANEOUS | Status: DC

## 2012-10-19 MED ORDER — "INSULIN SYRINGE 31G X 5/16"" 0.3 ML MISC": 27.0000 [IU]/d | Freq: Two times a day (BID) | Status: DC

## 2012-10-19 MED ORDER — POTASSIUM CHLORIDE CRYS ER 20 MEQ PO TBCR: 40.0000 meq | EXTENDED_RELEASE_TABLET | Freq: Once | ORAL | Status: DC

## 2012-10-19 MED ORDER — BD GETTING STARTED TAKE HOME KIT: 1/2ML X 30G SYRINGES
1.0000 | Freq: Once | Status: DC
  Filled 2012-10-19 (×2): qty 1

## 2012-10-19 MED ORDER — INSULIN ASPART PROT & ASPART (70-30 MIX) 100 UNIT/ML ~~LOC~~ SUSP: 27.0000 [IU] | Freq: Two times a day (BID) | SUBCUTANEOUS | Status: DC

## 2012-10-19 NOTE — Progress Notes (Signed)
Patient discharge teaching given, including activity, diet, follow-up appoints, and medications. Patient verbalized understanding of all discharge instructions. IV access was d/c'd. Vitals are stable. Skin is intact except as charted in most recent assessments. Pt to be escorted out by NT, to be driven home by family. 

## 2012-10-19 NOTE — Discharge Summary (Signed)
Physician Discharge Summary  Sean Anderson ZOX:[id number] DOB: [birth year] DOA: 10/17/2012  PCP: Sheila Oats, MD:  [facility]  Admit date: 10/17/2012 Discharge date: 10/19/2012  Time spent: 40  minutes  Recommendations for Outpatient Follow-up:  1. Newly diagnosed diabetic, hemoglobin A1c 12.5, recently started on insulin. Needs diabetic management 2.   Severe hepatic steatosis  Discharge Diagnoses:  Principal Problem:  *Nausea and vomiting Active Problems:  Abdominal pain  Hyperglycemia  Diabetes mellitus type 2 with hyperosmolarity, uncontrolled  Fatty liver disease, nonalcoholic   Discharge Condition: Stable, newly placed on insulin  Diet recommendation: Carb modified  Filed Weights   10/18/12 0010  Weight: 84.3 kg (185 lb 13.6 oz)    History of present illness:  31 year old male with no significant past medical history has been experiencing some polyuria and polydipsia for last one month. Since today morning Sean Anderson has been having multiple episodes of nausea vomiting and abdominal discomfort. Sean Anderson's abdominal discomfort has been diffuse. Since he was unable to keep anything in his vomiting was persistent he came to the ER. In the ER Sean Anderson was found to have hyperglycemia with mildly elevated anion gap. Sean Anderson has been admitted for further management. Acute abdominal series was unremarkable. Sean Anderson states that he's having having some retrosternal heartburn off and on for last few months. Denies any exertional symptoms or shortness of breath.  Hospital Course:  #1. Hyperglycemia. Sean Anderson was initially placed on an insulin drip. His anion gap closed. He was transitioned off the insulin drip and initially given Lantus, then changed to Novolin 7030. His hemoglobin A1c was 12.5. He received extensive diabetic education, and nutrition counseling. The diabetic coordinators [facility] and directed the Sean Anderson to call for  an appointment immediately after discharge. He's been given prescriptions for Novolin 70/30, glucometer, needles, syringes, and strips.  Sean Anderson reported that he drinks an inordinate amount of regular sodas and fruit juices daily. Perhaps eliminating these will be enough to control his diabetes. I am optimistic that with diet and exercise he may be able to be maintained with only oral medications.  #2. Nausea vomiting abdominal pain - most likely from #1 reason. Nausea / vomiting improved this morning. The day after admission he was able to tolerate a carb modified diet. His nausea and vomiting had completely resolved. A CT scan of his abdomen and pelvis have been ordered at the time of admission. It was almost canceled as the Sean Anderson's symptoms resolved so quickly. However he did receive a CT abdomen and pelvis which showed severe fatty liver, possibly nonalcoholic steatohepatitis. Sean Anderson LFTs are currently normal.  He reports that he only drinks beer on the weekend, and does not drink to excess. I am hopeful that his diabetic diet and exercise will also help his fatty liver.  #3. Heartburn and dysphagia - Sean Anderson states he's been having heartburn for last few months with occasional difficulties of swallowing. Utilize PPI therapy as needed. He will need GI followup if symptoms persist.       Consultations:  Diabetic coordinator  Discharge Exam: Filed Vitals:   10/18/12 0530 10/18/12 1414 10/18/12 2107 10/19/12 0448  BP: 99/59 109/65 106/70 110/72  Pulse: 86 80 74 68  Temp: 99.2 F (37.3 C) 98.4 F (36.9 C) 98.3 F (36.8 C) 98.1 F (36.7 C)  TempSrc: Oral Oral Oral Oral  Resp: 20 20 20 20   Height:      Weight:      SpO2: 93% 96% 96%  97%    General: Alert and oriented, no apparent distress. Cardiovascular: Regular rate and rhythm, no murmurs rubs or gallops Respiratory: Clear consultation, no wheezes crackles or rales Abdomen: Soft nontender nondistended with bowel sounds,  no masses. Extremities: no lower extremity edema Skin: No bruises rashes or lesions Psychiatric: Alert and oriented, no apparent distress, cooperative, well-groomed  Discharge Instructions      Discharge Orders    Future Orders Please Complete By Expires   Diet Carb Modified      Increase activity slowly          Medication List     As of 10/19/2012  2:49 PM    TAKE these medications         insulin NPH-insulin regular (70-30) 100 UNIT/ML injection   Commonly known as: NOVOLIN 70/30   Inject 27 Units into the skin 2 (two) times daily with a meal.      INSULIN SYRINGE .3CC/31GX5/16" 31G X 5/16" 0.3 ML Misc   27 Units/day by Does not apply route 2 (two) times daily with a meal.        Follow-up Information    Follow up with [facility]. Schedule an appointment as soon as possible for a visit in 2 days.   Contact information:   [phone]          The results of significant diagnostics from this hospitalization (including imaging, microbiology, ancillary and laboratory) are listed below for reference.    Significant Diagnostic Studies: Ct Abdomen Pelvis W Contrast  10/18/2012  *RADIOLOGY REPORT*  Clinical Data: Diffuse abdominal pain.  Nausea vomiting.  CT ABDOMEN AND PELVIS WITH CONTRAST  Technique:  Multidetector CT imaging of the abdomen and pelvis was performed following the standard protocol during bolus administration of intravenous contrast.  Contrast: OMNIPAQUE IOHEXOL 300 MG/ML  SOLN  Comparison: None.  Findings: Severe hepatic steatosis is demonstrated, however no liver masses are identified.  Gallbladder is unremarkable, and there is no evidence of biliary ductal dilatation.  The pancreas, spleen, adrenal glands, and kidneys are normal appearance except for a tiny right lower pole renal cyst.  No evidence of hydronephrosis.  No soft tissue masses or lymphadenopathy identified within the abdomen or pelvis.  No evidence of inflammatory process or  abnormal fluid collections. Normal appendix is visualized.  No evidence of bowel wall thickening, dilatation, or hernia.  No suspicious bone lesions are identified.  IMPRESSION:  1.  Severe hepatic steatosis.  Nonalcoholic steatohepatitis cannot be excluded. 2.  No other significant abnormality identified.   Original Report Authenticated By: Myles Rosenthal, M.D.    Dg Abd Acute W/chest  10/17/2012  *RADIOLOGY REPORT*  Clinical Data: Abdominal pain, vomiting.  ACUTE ABDOMEN SERIES (ABDOMEN 2 VIEW & CHEST 1 VIEW)  Comparison: None.  Findings: Lungs are clear.  Cardiomediastinal contours are within normal range.  No free intraperitoneal air. The bowel gas pattern is non- obstructive. Organ outlines are normal where seen. No acute or aggressive osseous abnormality identified.  IMPRESSION: Nonobstructive bowel gas pattern.   Original Report Authenticated By: Jearld Lesch, M.D.      Labs: Basic Metabolic Panel:  Lab 10/19/12 0981 10/18/12 0712 10/18/12 0148 10/17/12 2249 10/17/12 1740  NA 139 139 139 136 134*  K 4.0 3.6 3.2* 4.0 4.4  CL 106 105 103 100 94*  CO2 24 24 23 21 21   GLUCOSE 221* 136* 239* 265* 337*  BUN 8 12 13 13 14   CREATININE 0.61 0.71 0.68 0.67 0.77  CALCIUM 8.6 8.4 8.2* 8.3* 10.0  MG -- -- -- -- --  PHOS -- -- -- -- --   Liver Function Tests:  Lab 10/18/12 0712 10/17/12 1740  AST 20 35  ALT 51 79*  ALKPHOS 103 145*  BILITOT 0.8 1.1  PROT 6.0 7.5  ALBUMIN 3.4* 4.5    Lab 10/17/12 1740  LIPASE 23  AMYLASE --   CBC:  Lab 10/18/12 0712 10/18/12 0147 10/17/12 1740  WBC 5.5 6.1 9.9  NEUTROABS 3.4 -- 8.1*  HGB 14.7 14.7 18.0*  HCT 41.8 40.7 48.9  MCV 90.5 90.2 88.6  PLT 152 142* 176   Cardiac Enzymes:  Lab 10/18/12 1212 10/18/12 0712 10/18/12 0154 10/17/12 2306  CKTOTAL -- -- -- 25  CKMB -- -- -- --  CKMBINDEX -- -- -- --  TROPONINI <0.30 <0.30 <0.30 <0.30   CBG:  Lab 10/19/12 0752 10/18/12 2222 10/18/12 1725 10/18/12 1304 10/18/12 1059  GLUCAP 198* 209*  259* 271* 158*       Signed:  Conley Canal [phone]  [facility] 10/19/2012, 2:49 PM

## 2012-10-19 NOTE — Care Management Note (Signed)
    Page 1 of 1   10/19/2012     11:44:45 AM   CARE MANAGEMENT NOTE 10/19/2012  Patient:  Sean Anderson,Sean Anderson   Account Number:  [id number]  Date Initiated:  10/19/2012  Documentation initiated by:  Letha Cape  Subjective/Objective Assessment:   dx  n/v  admit as observation- lives with spouse.     Action/Plan:   Match program   Anticipated DC Date:  10/19/2012   Anticipated DC Plan:  HOME/SELF CARE      DC Planning Services  MATCH Program  CM consult      Choice offered to / List presented to:             Status of service:  Completed, signed off Medicare Important Message given?   (If response is "NO", the following Medicare IM given date fields will be blank) Date Medicare IM given:   Date Additional Medicare IM given:    Discharge Disposition:  HOME/SELF CARE  Per UR Regulation:  Reviewed for med. necessity/level of care/duration of stay  If discussed at Long Length of Stay Meetings, dates discussed:    Comments:  10/19/12 11:42 Letha Cape RN, BSN [phone] patient lives with spouse, pta independent. Patient has follow up with [facility] [place].  Patient has transportation at discharge.  Patient used the [employer].

## 2012-10-19 NOTE — Discharge Summary (Signed)
Addendum  Patient seen and examined, chart and data base reviewed.  I agree with the above assessment and discharge plan.  For full details please see Mrs. Algis Downs PA. Note.  New diagnosis of diabetes, hemoglobin A1c of 12.5.   Clint Lipps, MD [facility] Pager: [phone] 10/19/2012, 4:23 PM

## 2016-01-08 ENCOUNTER — Emergency Department
Admission: EM | Admit: 2016-01-08 | Discharge: 2016-01-08 | Disposition: A | Discharge status: Home / Self Care | Payer: Medicaid Other | Attending: Emergency Medicine | Admitting: Emergency Medicine

## 2016-01-08 ENCOUNTER — Encounter: Payer: Self-pay | Admitting: Emergency Medicine

## 2016-01-08 DIAGNOSIS — J0140 Acute pansinusitis, unspecified: Secondary | ICD-10-CM | POA: Diagnosis not present

## 2016-01-08 DIAGNOSIS — Z792 Long term (current) use of antibiotics: Secondary | ICD-10-CM | POA: Insufficient documentation

## 2016-01-08 DIAGNOSIS — Z880 Allergy status to penicillin: Secondary | ICD-10-CM | POA: Diagnosis not present

## 2016-01-08 DIAGNOSIS — E1100 Type 2 diabetes mellitus with hyperosmolarity without nonketotic hyperglycemic-hyperosmolar coma (NKHHC): Secondary | ICD-10-CM | POA: Diagnosis not present

## 2016-01-08 DIAGNOSIS — J45909 Unspecified asthma, uncomplicated: Secondary | ICD-10-CM | POA: Insufficient documentation

## 2016-01-08 DIAGNOSIS — R51 Headache: Secondary | ICD-10-CM | POA: Diagnosis present

## 2016-01-08 MED ORDER — DOXYCYCLINE HYCLATE 100 MG PO CAPS: 100.0000 mg | ORAL_CAPSULE | Freq: Two times a day (BID) | ORAL | Status: DC

## 2016-01-08 MED ORDER — PREDNISONE 10 MG PO TABS: ORAL_TABLET | ORAL | Status: DC

## 2016-01-08 NOTE — ED Provider Notes (Signed)
[facility] Emergency Department Provider Note  ____________________________________________  Time seen: Approximately 12:30 PM  I have reviewed the triage vital signs and the nursing notes.   HISTORY  Chief Complaint Facial Pain   HPI Sean Anderson is a 35 y.o. male is here with complaint of sinus pressure for approximately 1 week. Patient states he is now feeling swelling and tenderness over the right side of his face. He has a history of sinus infection has not had one in some time. He denies any vision changes, nausea, vomiting, fever or chills. He states he has had some posterior drainage. It is been taking over-the-counter medication without any improvement. He rates his pain 6/10.   Past Medical History  Diagnosis Date  . Asthma   . Diabetes mellitus without complication [facility])     Patient Active Problem List   Diagnosis Date Noted  . Diabetes mellitus type 2 with hyperosmolarity, uncontrolled (HCC) 10/19/2012  . Fatty liver disease, nonalcoholic 10/19/2012  . Abdominal pain 10/17/2012  . Nausea and vomiting 10/17/2012  . Hyperglycemia 10/17/2012    Past Surgical History  Procedure Laterality Date  . Knee surgery      Current Outpatient Rx  Name  Route  Sig  Dispense  Refill  . doxycycline (VIBRAMYCIN) 100 MG capsule   Oral   Take 1 capsule (100 mg total) by mouth 2 (two) times daily.   20 capsule   0   . insulin NPH-insulin regular (NOVOLIN 70/30) (70-30) 100 UNIT/ML injection   Subcutaneous   Inject 27 Units into the skin 2 (two) times daily with a meal.   10 mL   3   . Insulin Syringe-Needle U-100 (INSULIN SYRINGE .3CC/31GX5/16") 31G X 5/16" 0.3 ML MISC   Does not apply   27 Units/day by Does not apply route 2 (two) times daily with a meal.   1 each   1   . predniSONE (DELTASONE) 10 MG tablet      Take 6 tablets  today, on day 2 take 5 tablets, day 3 take 4 tablets, day 4 take 3 tablets, day 5 take  2 tablets and 1 tablet the  last day   21 tablet   0     Allergies Banana; Penicillins; and Pineapple  Family History  Problem Relation Age of Onset  . Colon cancer Other     Social History Social History  Substance Use Topics  . Smoking status: Never Smoker   . Smokeless tobacco: None  . Alcohol Use: Yes    Review of Systems Constitutional: No fever/chills Eyes: No visual changes. ENT: No sore throat. Positive sinus pain right side. Cardiovascular: Denies chest pain. Respiratory: Denies shortness of breath. Gastrointestinal:   No nausea, no vomiting.   Musculoskeletal: Negative for back pain. Skin: Negative for rash. Neurological: Positive for headaches, no focal weakness or numbness.  10-point ROS otherwise negative.  ____________________________________________   PHYSICAL EXAM:  VITAL SIGNS: ED Triage Vitals  Enc Vitals Group     BP 01/08/16 1159 131/98 mmHg     Pulse Rate 01/08/16 1159 81     Resp 01/08/16 1159 18     Temp 01/08/16 1159 97.5 F (36.4 C)     Temp Source 01/08/16 1159 Oral     SpO2 01/08/16 1159 99 %     Weight 01/08/16 1159 178 lb (80.74 kg)     Height 01/08/16 1159  (1.778 m)     Head Cir --  Peak Flow --      Pain Score 01/08/16 1157 6     Pain Loc --      Pain Edu? --      Excl. in GC? --     Constitutional: Alert and oriented. Well appearing and in no acute distress. Eyes: Conjunctivae are normal. PERRL. EOMI. Head: Atraumatic. Nose: Mild congestion/rhinnorhea. EACs and TMs are clear bilaterally. Mouth/Throat: Mucous membranes are moist.  Oropharynx non-erythematous. Positive posterior drainage. Neck: No stridor.  Supple. Hematological/Lymphatic/Immunilogical: No cervical lymphadenopathy. Cardiovascular: Normal rate, regular rhythm. Grossly normal heart sounds.  Good peripheral circulation. Respiratory: Normal respiratory effort.  No retractions. Lungs CTAB. Musculoskeletal: His upper and lower extremities without any difficulty in normal  gait was noted in the emergency room. Neurologic:  Normal speech and language. No gross focal neurologic deficits are appreciated. No gait instability. Skin:  Skin is warm, dry and intact. No rash noted. Psychiatric: Mood and affect are normal. Speech and behavior are normal.  ____________________________________________   LABS (all labs ordered are listed, but only abnormal results are displayed)  Labs Reviewed - No data to display   PROCEDURES  Procedure(s) performed: None  Critical Care performed: No  ____________________________________________   INITIAL IMPRESSION / ASSESSMENT AND PLAN / ED COURSE  Pertinent labs & imaging results that were available during my care of the patient were reviewed by me and considered in my medical decision making (see chart for details).  She was placed on doxycycline her milligrams twice a day for 10 days along with prednisone 10 mg 60 mg taper for 6 days. Patient was made aware that this will cause his blood sugars to elevate and he is to check his blood sugars more frequently. He is to follow-up with his doctor or Dr. Andee Poles  if any continued problems. ____________________________________________   FINAL CLINICAL IMPRESSION(S) / ED DIAGNOSES  Final diagnoses:  Acute pansinusitis, recurrence not specified      Tommi Rumps, PA-C 01/08/16 1739  Minna Antis, MD 01/09/16 2259

## 2016-01-08 NOTE — ED Notes (Signed)
See triage   States he usually can take OTC meds ..but sx's are worse

## 2016-01-08 NOTE — Discharge Instructions (Signed)
Sinusitis, Adult Sinusitis is redness, soreness, and puffiness (inflammation) of the air pockets in the bones of your face (sinuses). The redness, soreness, and puffiness can cause air and mucus to get trapped in your sinuses. This can allow germs to grow and cause an infection.  HOME CARE   Drink enough fluids to keep your pee (urine) clear or pale yellow.  Use a humidifier in your home.  Run a hot shower to create steam in the bathroom. Sit in the bathroom with the door closed. Breathe in the steam 3-4 times a day.  Put a warm, moist washcloth on your face 3-4 times a day, or as told by your doctor.  Use salt water sprays (saline sprays) to wet the thick fluid in your nose. This can help the sinuses drain.  Only take medicine as told by your doctor. GET HELP RIGHT AWAY IF:   Your pain gets worse.  You have very bad headaches.  You are sick to your stomach (nauseous).  You throw up (vomit).  You are very sleepy (drowsy) all the time.  Your face is puffy (swollen).  Your vision changes.  You have a stiff neck.  You have trouble breathing. MAKE SURE YOU:   Understand these instructions.  Will watch your condition.  Will get help right away if you are not doing well or get worse.   This information is not intended to replace advice given to you by your health care provider. Make sure you discuss any questions you have with your health care provider.   Document Released: 05/05/2008 Document Revised: 12/08/2014 Document Reviewed: 06/22/2012 Elsevier Interactive Patient Education [employer]    Begin taking medication today. Follow-up with [facility] if any continued problems. You may also take Tylenol for headache if needed

## 2016-01-08 NOTE — ED Notes (Signed)
Sinus pressure for about 1 week  Now having swelling and tenderness to right of face

## 2019-05-28 ENCOUNTER — Other Ambulatory Visit: Payer: Self-pay | Admitting: Family Medicine

## 2019-05-28 DIAGNOSIS — Z20822 Contact with and (suspected) exposure to covid-19: Secondary | ICD-10-CM

## 2019-06-03 LAB — NOVEL CORONAVIRUS, NAA: SARS-CoV-2, NAA: NOT DETECTED

## 2020-09-11 ENCOUNTER — Emergency Department: Payer: Self-pay

## 2020-09-11 ENCOUNTER — Emergency Department
Admission: EM | Admit: 2020-09-11 | Discharge: 2020-09-11 | Disposition: A | Discharge status: Home / Self Care | Payer: Self-pay | Attending: Emergency Medicine | Admitting: Emergency Medicine

## 2020-09-11 ENCOUNTER — Other Ambulatory Visit: Payer: Self-pay

## 2020-09-11 DIAGNOSIS — E119 Type 2 diabetes mellitus without complications: Secondary | ICD-10-CM | POA: Insufficient documentation

## 2020-09-11 DIAGNOSIS — B349 Viral infection, unspecified: Secondary | ICD-10-CM | POA: Insufficient documentation

## 2020-09-11 DIAGNOSIS — R110 Nausea: Secondary | ICD-10-CM

## 2020-09-11 DIAGNOSIS — J45909 Unspecified asthma, uncomplicated: Secondary | ICD-10-CM | POA: Insufficient documentation

## 2020-09-11 DIAGNOSIS — Z794 Long term (current) use of insulin: Secondary | ICD-10-CM | POA: Insufficient documentation

## 2020-09-11 LAB — CBC WITH DIFFERENTIAL/PLATELET
Abs Immature Granulocytes: 0.03 10*3/uL (ref 0.00–0.07)
Basophils Absolute: 0 10*3/uL (ref 0.0–0.1)
Basophils Relative: 1 %
Eosinophils Absolute: 0 10*3/uL (ref 0.0–0.5)
Eosinophils Relative: 1 %
HCT: 45.8 % (ref 39.0–52.0)
Hemoglobin: 16.3 g/dL (ref 13.0–17.0)
Immature Granulocytes: 1 %
Lymphocytes Relative: 11 %
Lymphs Abs: 0.6 10*3/uL — ABNORMAL LOW (ref 0.7–4.0)
MCH: 31.8 pg (ref 26.0–34.0)
MCHC: 35.6 g/dL (ref 30.0–36.0)
MCV: 89.3 fL (ref 80.0–100.0)
Monocytes Absolute: 0.5 10*3/uL (ref 0.1–1.0)
Monocytes Relative: 8 %
Neutro Abs: 4.8 10*3/uL (ref 1.7–7.7)
Neutrophils Relative %: 78 %
Platelets: 171 10*3/uL (ref 150–400)
RBC: 5.13 MIL/uL (ref 4.22–5.81)
RDW: 12.1 % (ref 11.5–15.5)
WBC: 6 10*3/uL (ref 4.0–10.5)
nRBC: 0 % (ref 0.0–0.2)

## 2020-09-11 LAB — BASIC METABOLIC PANEL
Anion gap: 15 (ref 5–15)
BUN: 13 mg/dL (ref 6–20)
CO2: 23 mmol/L (ref 22–32)
Calcium: 9.6 mg/dL (ref 8.9–10.3)
Chloride: 97 mmol/L — ABNORMAL LOW (ref 98–111)
Creatinine, Ser: 0.78 mg/dL (ref 0.61–1.24)
GFR, Estimated: >60 mL/min (ref >60)
Glucose, Bld: 262 mg/dL — ABNORMAL HIGH (ref 70–99)
Potassium: 4.1 mmol/L (ref 3.5–5.1)
Sodium: 135 mmol/L (ref 135–145)

## 2020-09-11 MED ORDER — LACTATED RINGERS IV BOLUS
1000.0000 mL | Freq: Once | INTRAVENOUS | Status: AC
  Administered 2020-09-11: 1000 mL via INTRAVENOUS

## 2020-09-11 MED ORDER — ONDANSETRON 4 MG PO TBDP: 4.0000 mg | ORAL_TABLET | Freq: Three times a day (TID) | ORAL | 0 refills | Status: DC | PRN

## 2020-09-11 MED ORDER — ONDANSETRON HCL 4 MG/2ML IJ SOLN
4.0000 mg | Freq: Once | INTRAMUSCULAR | Status: AC
  Administered 2020-09-11: 4 mg via INTRAVENOUS
  Filled 2020-09-11: qty 2

## 2020-09-11 MED ORDER — KETOROLAC TROMETHAMINE 30 MG/ML IJ SOLN
15.0000 mg | Freq: Once | INTRAMUSCULAR | Status: AC
  Administered 2020-09-11: 15 mg via INTRAVENOUS
  Filled 2020-09-11: qty 1

## 2020-09-11 NOTE — ED Notes (Signed)
Labs d/c d/t pt had bloodwork this morning at [facility]

## 2020-09-11 NOTE — ED Triage Notes (Signed)
PT to ED c/o back pain, intermittent fever since friday. Noone else at home is sick.  PT states [facility] sent him over here for glucose testing for his diabetes. Negative covid test.

## 2020-09-11 NOTE — ED Provider Notes (Signed)
[facility] Emergency Department Provider Note   ____________________________________________   First MD Initiated Contact with Patient 09/11/20 1923     (approximate)  I have reviewed the triage vital signs and the nursing notes.   HISTORY  Chief Complaint Fever    HPI Sean Anderson is a 39 y.o. male with past medical history of diabetes and asthma who presents to the ED complaining of fevers and malaise.  Patient reports that for the past 4 days he has been dealing with subjective fevers and chills along with malaise and diffuse body aches.  He denies any associated cough, chest pain, or shortness of breath.  He has been feeling nauseous, but has not vomited, had diarrhea, or dealt with abdominal pain.  He does not currently take any medications for his diabetes, has been attempting to manage this with diet and exercise.  He was seen at the [facility] clinic today, where there was concerns for hyperglycemia and fever of unknown etiology, resulting in patient being sent to the ED for further evaluation.  He had COVID-19 testing performed at [facility] clinic which was negative.        Past Medical History:  Diagnosis Date  . Asthma   . Diabetes mellitus without complication [facility])     Patient Active Problem List   Diagnosis Date Noted  . Diabetes mellitus type 2 with hyperosmolarity, uncontrolled (HCC) 10/19/2012  . Fatty liver disease, nonalcoholic 10/19/2012  . Abdominal pain 10/17/2012  . Nausea and vomiting 10/17/2012  . Hyperglycemia 10/17/2012    Past Surgical History:  Procedure Laterality Date  . KNEE SURGERY      Prior to Admission medications   Medication Sig Start Date End Date Taking? Authorizing Provider  doxycycline (VIBRAMYCIN) 100 MG capsule Take 1 capsule (100 mg total) by mouth 2 (two) times daily. 01/08/16   Tommi Rumps, PA-C  insulin NPH-insulin regular (NOVOLIN 70/30) (70-30) 100 UNIT/ML injection Inject 27 Units into the  skin 2 (two) times daily with a meal. 10/19/12   Dellinger, Tora Kindred, PA-C  Insulin Syringe-Needle U-100 (INSULIN SYRINGE .3CC/31GX5/16") 31G X 5/16" 0.3 ML MISC 27 Units/day by Does not apply route 2 (two) times daily with a meal. 10/19/12   Dellinger, Clerance Lav L, PA-C  ondansetron (ZOFRAN ODT) 4 MG disintegrating tablet Take 1 tablet (4 mg total) by mouth every 8 (eight) hours as needed for nausea or vomiting. 09/11/20   Chesley Noon, MD  predniSONE (DELTASONE) 10 MG tablet Take 6 tablets  today, on day 2 take 5 tablets, day 3 take 4 tablets, day 4 take 3 tablets, day 5 take  2 tablets and 1 tablet the last day 01/08/16   Tommi Rumps, PA-C    Allergies Banana, Penicillins, and Pineapple  Family History  Problem Relation Age of Onset  . Colon cancer Other     Social History Social History   Tobacco Use  . Smoking status: Never Smoker  . Smokeless tobacco: Never Used  Substance Use Topics  . Alcohol use: Yes  . Drug use: No    Review of Systems  Constitutional: Positive for fevers, chills, and malaise. Eyes: No visual changes. ENT: No sore throat. Cardiovascular: Denies chest pain. Respiratory: Denies shortness of breath. Gastrointestinal: No abdominal pain.  Positive for nausea, no vomiting.  No diarrhea.  No constipation. Genitourinary: Negative for dysuria. Musculoskeletal: Negative for back pain.  Positive for myalgias. Skin: Negative for rash. Neurological: Negative for headaches, focal weakness or numbness.  ____________________________________________  PHYSICAL EXAM:  VITAL SIGNS: ED Triage Vitals  Enc Vitals Group     BP 09/11/20 1712 123/89     Pulse Rate 09/11/20 1712 (!) 102     Resp 09/11/20 1712 19     Temp 09/11/20 1712 98.5 F (36.9 C)     Temp Source 09/11/20 1712 Oral     SpO2 09/11/20 1712 100 %     Weight 09/11/20 1713 165 lb (74.8 kg)     Height 09/11/20 1713 5\' 10"  (1.778 m)     Head Circumference --      Peak Flow --      Pain  Score 09/11/20 1712 4     Pain Loc --      Pain Edu? --      Excl. in GC? --     Constitutional: Alert and oriented. Eyes: Conjunctivae are normal. Head: Atraumatic. Nose: No congestion/rhinnorhea. Mouth/Throat: Mucous membranes are dry. Neck: Normal ROM Cardiovascular: Normal rate, regular rhythm. Grossly normal heart sounds. Respiratory: Normal respiratory effort.  No retractions. Lungs CTAB. Gastrointestinal: Soft and nontender. No distention. Genitourinary: deferred Musculoskeletal: No lower extremity tenderness nor edema. Neurologic:  Normal speech and language. No gross focal neurologic deficits are appreciated. Skin:  Skin is warm, dry and intact. No rash noted. Psychiatric: Mood and affect are normal. Speech and behavior are normal.  ____________________________________________   LABS (all labs ordered are listed, but only abnormal results are displayed)  Labs Reviewed  BASIC METABOLIC PANEL - Abnormal; Notable for the following components:      Result Value   Chloride 97 (*)    Glucose, Bld 262 (*)    All other components within normal limits  CBC WITH DIFFERENTIAL/PLATELET - Abnormal; Notable for the following components:   Lymphs Abs 0.6 (*)    All other components within normal limits  URINALYSIS, COMPLETE (UACMP) WITH MICROSCOPIC     PROCEDURES  Procedure(s) performed (including Critical Care):  Procedures   ____________________________________________   INITIAL IMPRESSION / ASSESSMENT AND PLAN / ED COURSE       39 year old male with past medical history of diabetes presents to the ED complaining of subjective fevers and malaise for the past 4 days.  He has some nausea but otherwise no specific infectious symptoms.  Abdominal exam is reassuring, oropharynx clear, no evidence of skin or soft tissue infection.  Chest x-ray was performed and negative by my read.  UA performed at [facility] clinic was borderline for infection and we will repeat here.  We  will hydrate with IV fluids, treat symptomatically with IV Toradol and Zofran.  COVID-19 testing was performed and negative, flu testing also negative.  Other viral illness seems to be the most likely etiology of his symptoms at this time given there is no apparent source of bacterial illness.  His vital signs are not consistent with sepsis.  Patient unable to provide urine sample, given his lack of urinary symptoms, very low suspicion for UTI and his symptoms are likely to be viral in origin. Lab work is reassuring and shows only mild hyperglycemia. Patient is appropriate for discharge home with PCP follow-up and was counseled to return to the ED for any new or worsening symptoms.      ____________________________________________   FINAL CLINICAL IMPRESSION(S) / ED DIAGNOSES  Final diagnoses:  Viral illness  Nausea     ED Discharge Orders         Ordered    ondansetron (ZOFRAN ODT) 4 MG disintegrating tablet  Every  8 hours PRN        09/11/20 2052           Note:  This document was prepared using Dragon voice recognition software and may include unintentional dictation errors.   Chesley Noon, MD 09/11/20 2055

## 2020-11-16 ENCOUNTER — Emergency Department
Admission: EM | Admit: 2020-11-16 | Discharge: 2020-11-16 | Disposition: A | Discharge status: Home / Self Care | Payer: Self-pay | Attending: Emergency Medicine | Admitting: Emergency Medicine

## 2020-11-16 ENCOUNTER — Other Ambulatory Visit: Payer: Self-pay

## 2020-11-16 ENCOUNTER — Encounter: Payer: Self-pay | Admitting: Emergency Medicine

## 2020-11-16 DIAGNOSIS — R112 Nausea with vomiting, unspecified: Secondary | ICD-10-CM | POA: Insufficient documentation

## 2020-11-16 DIAGNOSIS — R1030 Lower abdominal pain, unspecified: Secondary | ICD-10-CM | POA: Insufficient documentation

## 2020-11-16 DIAGNOSIS — Z5321 Procedure and treatment not carried out due to patient leaving prior to being seen by health care provider: Secondary | ICD-10-CM | POA: Insufficient documentation

## 2020-11-16 LAB — URINALYSIS, COMPLETE (UACMP) WITH MICROSCOPIC
Bacteria, UA: NONE SEEN
Bilirubin Urine: NEGATIVE
Glucose, UA: >=500 mg/dL — AB
Hgb urine dipstick: NEGATIVE
Ketones, ur: 5 mg/dL — AB
Nitrite: NEGATIVE
Protein, ur: NEGATIVE mg/dL
Specific Gravity, Urine: 1.028 (ref 1.005–1.030)
Squamous Epithelial / LPF: NONE SEEN (ref 0–5)
pH: 5 (ref 5.0–8.0)

## 2020-11-16 LAB — CBC WITH DIFFERENTIAL/PLATELET
Abs Immature Granulocytes: 0.01 10*3/uL (ref 0.00–0.07)
Basophils Absolute: 0 10*3/uL (ref 0.0–0.1)
Basophils Relative: 0 %
Eosinophils Absolute: 0.1 10*3/uL (ref 0.0–0.5)
Eosinophils Relative: 2 %
HCT: 39.4 % (ref 39.0–52.0)
Hemoglobin: 14.3 g/dL (ref 13.0–17.0)
Immature Granulocytes: 0 %
Lymphocytes Relative: 31 %
Lymphs Abs: 1.8 10*3/uL (ref 0.7–4.0)
MCH: 31.1 pg (ref 26.0–34.0)
MCHC: 36.3 g/dL — ABNORMAL HIGH (ref 30.0–36.0)
MCV: 85.7 fL (ref 80.0–100.0)
Monocytes Absolute: 0.4 10*3/uL (ref 0.1–1.0)
Monocytes Relative: 7 %
Neutro Abs: 3.5 10*3/uL (ref 1.7–7.7)
Neutrophils Relative %: 60 %
Platelets: 241 10*3/uL (ref 150–400)
RBC: 4.6 MIL/uL (ref 4.22–5.81)
RDW: 12.7 % (ref 11.5–15.5)
WBC: 5.8 10*3/uL (ref 4.0–10.5)
nRBC: 0 % (ref 0.0–0.2)

## 2020-11-16 LAB — LIPASE, BLOOD: Lipase: 160 U/L — ABNORMAL HIGH (ref 11–51)

## 2020-11-16 LAB — COMPREHENSIVE METABOLIC PANEL
ALT: 14 U/L (ref 0–44)
AST: 11 U/L — ABNORMAL LOW (ref 15–41)
Albumin: 3.7 g/dL (ref 3.5–5.0)
Alkaline Phosphatase: 97 U/L (ref 38–126)
Anion gap: 11 (ref 5–15)
BUN: 14 mg/dL (ref 6–20)
CO2: 24 mmol/L (ref 22–32)
Calcium: 9.1 mg/dL (ref 8.9–10.3)
Chloride: 97 mmol/L — ABNORMAL LOW (ref 98–111)
Creatinine, Ser: 0.97 mg/dL (ref 0.61–1.24)
GFR, Estimated: >60 mL/min (ref >60)
Glucose, Bld: 494 mg/dL — ABNORMAL HIGH (ref 70–99)
Potassium: 4.3 mmol/L (ref 3.5–5.1)
Sodium: 132 mmol/L — ABNORMAL LOW (ref 135–145)
Total Bilirubin: 0.8 mg/dL (ref 0.3–1.2)
Total Protein: 7.3 g/dL (ref 6.5–8.1)

## 2020-11-16 NOTE — ED Triage Notes (Signed)
Patient ambulatory to triage with steady gait, without difficulty or distress noted; pt reports lower abd pain since last night accomp by N/V 

## 2020-11-16 NOTE — ED Notes (Signed)
No answer when called from lobby 

## 2022-02-26 ENCOUNTER — Ambulatory Visit: Payer: Self-pay | Admitting: Adult Health

## 2022-03-26 IMAGING — CR DG CHEST 2V
1 series · 2 of 2 positions shown · non-contrast
Comparison: None.

CLINICAL DATA: Fever

EXAM:
CHEST - 2 VIEW

[Series 1: dg chest 2 view · 0.14mm/px · 2 of 2 slices shown]
[im 1/2]
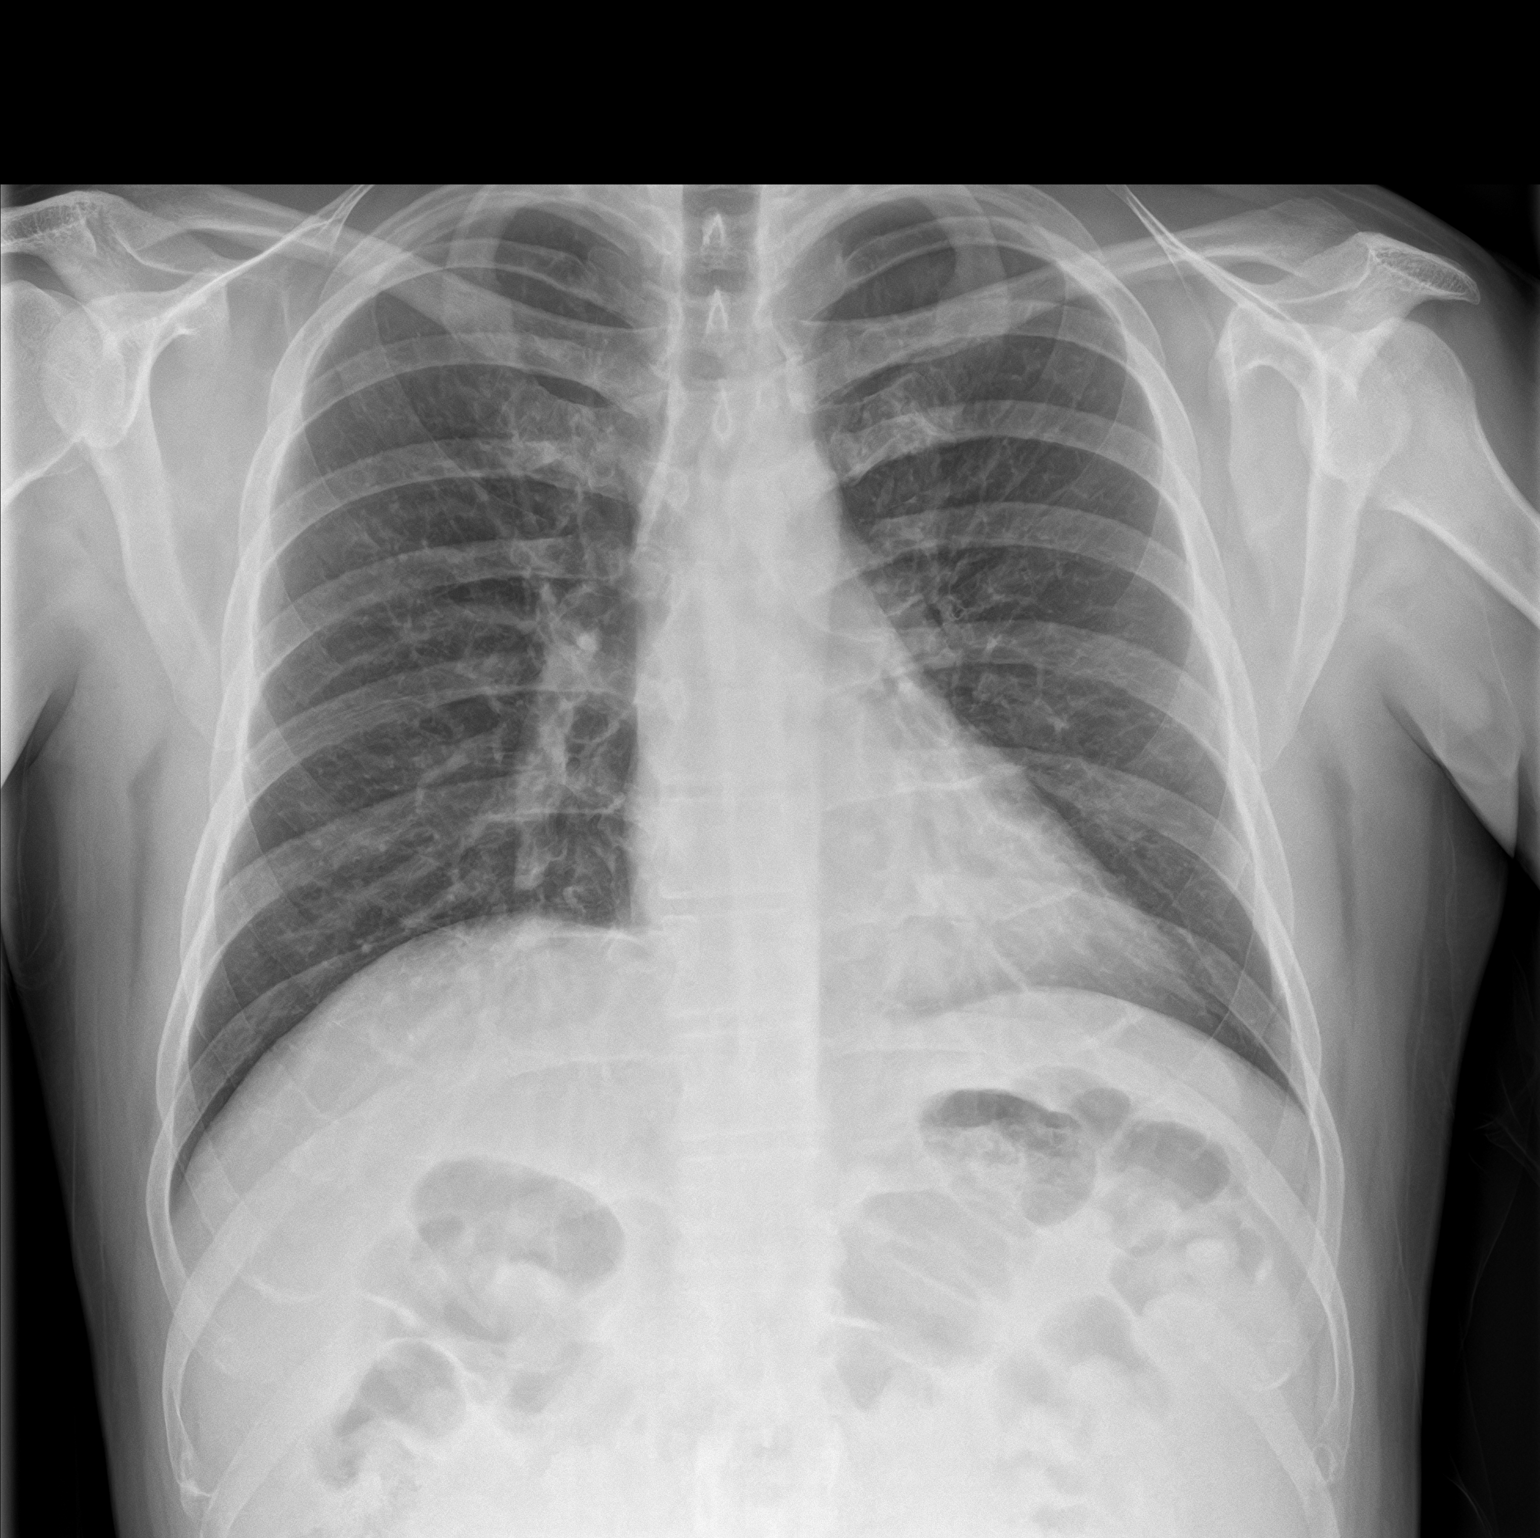
[im 2/2]
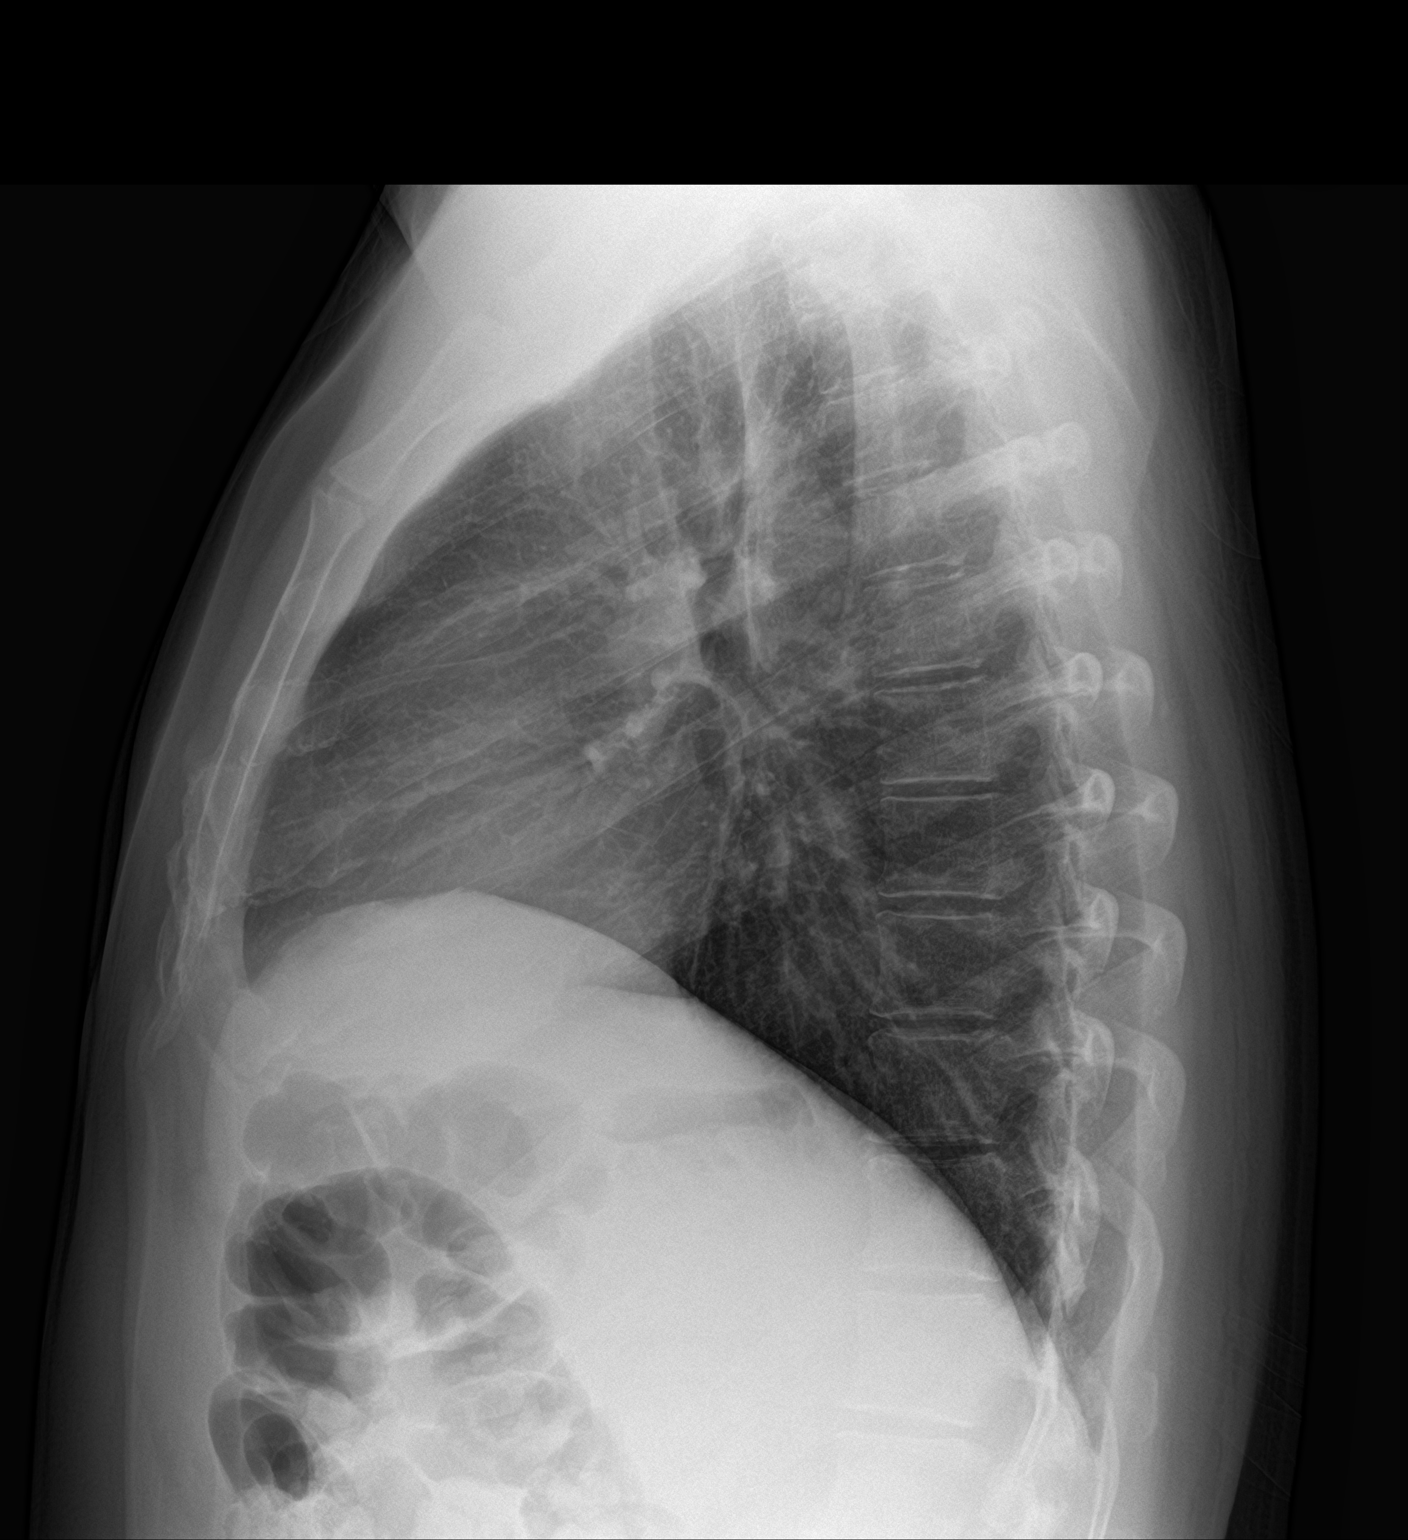

[2 of 2 positions shown; findings below may reference images not displayed]

FINDINGS: The heart size and mediastinal contours are within normal limits.
Both lungs are clear. No pleural effusions or pneumothorax. The
visualized skeletal structures are unremarkable.
IMPRESSION: No acute cardiopulmonary disease.

## 2023-02-27 ENCOUNTER — Other Ambulatory Visit: Payer: Self-pay | Admitting: Internal Medicine

## 2023-03-04 ENCOUNTER — Ambulatory Visit: Payer: Commercial Managed Care - HMO | Admitting: Internal Medicine

## 2023-03-31 ENCOUNTER — Ambulatory Visit (INDEPENDENT_AMBULATORY_CARE_PROVIDER_SITE_OTHER): Payer: Commercial Managed Care - HMO | Admitting: Internal Medicine

## 2023-03-31 ENCOUNTER — Other Ambulatory Visit: Payer: Self-pay | Admitting: Internal Medicine

## 2023-03-31 ENCOUNTER — Other Ambulatory Visit: Payer: Commercial Managed Care - HMO

## 2023-03-31 ENCOUNTER — Encounter: Payer: Self-pay | Admitting: Internal Medicine

## 2023-03-31 VITALS — BP 110/70 | HR 87 | Ht 70.0 in | Wt 172.0 lb

## 2023-03-31 DIAGNOSIS — N528 Other male erectile dysfunction: Secondary | ICD-10-CM

## 2023-03-31 DIAGNOSIS — E119 Type 2 diabetes mellitus without complications: Secondary | ICD-10-CM | POA: Diagnosis not present

## 2023-03-31 DIAGNOSIS — K760 Fatty (change of) liver, not elsewhere classified: Secondary | ICD-10-CM | POA: Diagnosis not present

## 2023-03-31 LAB — GLUCOSE, POCT (MANUAL RESULT ENTRY): POC Glucose: 246 mg/dl — AB (ref 70–99)

## 2023-03-31 MED ORDER — SILDENAFIL CITRATE 100 MG PO TABS: 50.0000 mg | ORAL_TABLET | Freq: Every day | ORAL | 2 refills | Status: DC | PRN

## 2023-03-31 MED ORDER — FARXIGA 10 MG PO TABS
10.0000 mg | ORAL_TABLET | Freq: Every morning | ORAL | 0 refills | Status: DC
Start: 2023-03-31 — End: 2023-07-01

## 2023-03-31 NOTE — Progress Notes (Signed)
Established Patient Office Visit  Subjective:  Patient ID: Sean Anderson, male    DOB: 1981-05-13  Age: 42 y.o. MRN: [id number]  Chief Complaint  Patient presents with   Follow-up    3  month lab results    No new complaints, here for lab review and medication refills. Failed to have previsit labs done.     No other concerns at this time.   Past Medical History:  Diagnosis Date   Asthma    Diabetes mellitus without complication (HCC)     Past Surgical History:  Procedure Laterality Date   KNEE SURGERY      Social History   Socioeconomic History   Marital status: Single    Spouse name: Not on file   Number of children: Not on file   Years of education: Not on file   Highest education level: Not on file  Occupational History   Not on file  Tobacco Use   Smoking status: Never   Smokeless tobacco: Never  Substance and Sexual Activity   Alcohol use: Yes   Drug use: No   Sexual activity: Not on file  Other Topics Concern   Not on file  Social History Narrative   Not on file   Social Determinants of Health   Financial Resource Strain: Not on file  Food Insecurity: Not on file  Transportation Needs: Not on file  Physical Activity: Not on file  Stress: Not on file  Social Connections: Not on file  Intimate Partner Violence: Not on file    Family History  Problem Relation Age of Onset   Colon cancer Other     Allergies  Allergen Reactions   Banana    Penicillins Hives   Pineapple     Review of Systems  Constitutional:  Negative for weight loss.  HENT: Negative.    Eyes: Negative.   Respiratory: Negative.    Cardiovascular: Negative.   Gastrointestinal: Negative.   Genitourinary: Negative.  Negative for frequency.  Skin: Negative.   Neurological: Negative.   Endo/Heme/Allergies: Negative.        Objective:   BP 110/70   Pulse 87   Ht 5\' 10"  (1.778 m)   Wt 172 lb (78 kg)   SpO2 95%   BMI 24.68 kg/m   Vitals:   03/31/23 1030   BP: 110/70  Pulse: 87  Height: 5\' 10"  (1.778 m)  Weight: 172 lb (78 kg)  SpO2: 95%  BMI (Calculated): 24.68    Physical Exam Vitals reviewed.  Constitutional:      Appearance: Normal appearance.  HENT:     Head: Normocephalic.     Left Ear: There is no impacted cerumen.     Nose: Nose normal.     Mouth/Throat:     Mouth: Mucous membranes are moist.     Pharynx: No posterior oropharyngeal erythema.  Eyes:     Extraocular Movements: Extraocular movements intact.     Pupils: Pupils are equal, round, and reactive to light.  Cardiovascular:     Rate and Rhythm: Regular rhythm.     Chest Wall: PMI is not displaced.     Pulses: Normal pulses.     Heart sounds: Normal heart sounds. No murmur heard. Pulmonary:     Effort: Pulmonary effort is normal.     Breath sounds: Normal air entry. No rhonchi or rales.  Abdominal:     General: Abdomen is flat. Bowel sounds are normal. There is no distension.  Palpations: Abdomen is soft. There is no hepatomegaly, splenomegaly or mass.     Tenderness: There is no abdominal tenderness.  Musculoskeletal:        General: Normal range of motion.     Cervical back: Normal range of motion and neck supple.     Right lower leg: No edema.     Left lower leg: No edema.  Skin:    General: Skin is warm and dry.  Neurological:     General: No focal deficit present.     Mental Status: He is alert and oriented to person, place, and time.     Cranial Nerves: No cranial nerve deficit.     Motor: No weakness.  Psychiatric:        Mood and Affect: Mood normal.        Behavior: Behavior normal.      Results for orders placed or performed in visit on 03/31/23  POCT Glucose (CBG)  Result Value Ref Range   POC Glucose 246 (A) 70 - 99 mg/dl    Recent Results (from the past 2160 hour(Kymorah Korf))  POCT Glucose (CBG)     Status: Abnormal   Collection Time: 03/31/23 10:39 AM  Result Value Ref Range   POC Glucose 246 (A) 70 - 99 mg/dl      Assessment &  Plan:   Problem List Items Addressed This Visit   None Visit Diagnoses     Type 2 diabetes mellitus without complication, without long-term current use of insulin (HCC)    -  Primary   Relevant Medications   FARXIGA 10 MG TABS tablet   Other Relevant Orders   POCT Glucose (CBG) (Completed)       No follow-ups on file.   Total time spent: 20 minutes  Luna Fuse, MD  03/31/2023

## 2023-04-01 LAB — LIPID PANEL W/O CHOL/HDL RATIO
Cholesterol, Total: 166 mg/dL (ref 100–199)
HDL: 54 mg/dL (ref >39)
LDL Chol Calc (NIH): 89 mg/dL (ref 0–99)
Triglycerides: 128 mg/dL (ref 0–149)
VLDL Cholesterol Cal: 23 mg/dL (ref 5–40)

## 2023-04-01 LAB — HGB A1C W/O EAG: Hgb A1c MFr Bld: 7.5 % — ABNORMAL HIGH (ref 4.8–5.6)

## 2023-04-01 MED ORDER — PIOGLITAZONE HCL 15 MG PO TABS: 15.0000 mg | ORAL_TABLET | Freq: Every day | ORAL | 2 refills | Status: DC

## 2023-04-01 NOTE — Progress Notes (Signed)
Patient notified

## 2023-04-01 NOTE — Addendum Note (Signed)
Addended byKatherine Mantle on: 04/01/2023 10:27 AM   Modules accepted: Orders

## 2023-06-04 ENCOUNTER — Other Ambulatory Visit: Payer: Self-pay | Admitting: Nurse Practitioner

## 2023-06-29 ENCOUNTER — Other Ambulatory Visit: Payer: Commercial Managed Care - HMO

## 2023-06-29 DIAGNOSIS — E119 Type 2 diabetes mellitus without complications: Secondary | ICD-10-CM

## 2023-06-29 DIAGNOSIS — K760 Fatty (change of) liver, not elsewhere classified: Secondary | ICD-10-CM

## 2023-06-30 LAB — HEMOGLOBIN A1C
Est. average glucose Bld gHb Est-mCnc: 186 mg/dL
Hgb A1c MFr Bld: 8.1 % — ABNORMAL HIGH (ref 4.8–5.6)

## 2023-07-01 ENCOUNTER — Encounter: Payer: Self-pay | Admitting: Internal Medicine

## 2023-07-01 ENCOUNTER — Ambulatory Visit (INDEPENDENT_AMBULATORY_CARE_PROVIDER_SITE_OTHER): Payer: Commercial Managed Care - HMO | Admitting: Internal Medicine

## 2023-07-01 VITALS — BP 110/80 | HR 85 | Ht 70.0 in | Wt 170.8 lb

## 2023-07-01 DIAGNOSIS — Z0001 Encounter for general adult medical examination with abnormal findings: Secondary | ICD-10-CM

## 2023-07-01 DIAGNOSIS — E119 Type 2 diabetes mellitus without complications: Secondary | ICD-10-CM

## 2023-07-01 DIAGNOSIS — K760 Fatty (change of) liver, not elsewhere classified: Secondary | ICD-10-CM

## 2023-07-01 DIAGNOSIS — Z1331 Encounter for screening for depression: Secondary | ICD-10-CM | POA: Diagnosis not present

## 2023-07-01 LAB — POC CREATINE & ALBUMIN,URINE
Albumin/Creatinine Ratio, Urine, POC: <30
Creatinine, POC: 50 mg/dL
Microalbumin Ur, POC: 10 mg/L

## 2023-07-01 LAB — POCT CBG (FASTING - GLUCOSE)-MANUAL ENTRY: Glucose Fasting, POC: 149 mg/dL — AB (ref 70–99)

## 2023-07-01 MED ORDER — PIOGLITAZONE HCL 15 MG PO TABS: 15.0000 mg | ORAL_TABLET | Freq: Every day | ORAL | 0 refills | Status: DC

## 2023-07-01 MED ORDER — METFORMIN HCL ER 500 MG PO TB24
1000.0000 mg | ORAL_TABLET | Freq: Two times a day (BID) | ORAL | 0 refills | Status: DC
Start: 2023-07-01 — End: 2023-09-23

## 2023-07-01 MED ORDER — FARXIGA 10 MG PO TABS
10.0000 mg | ORAL_TABLET | Freq: Every morning | ORAL | 0 refills | Status: DC
Start: 2023-07-01 — End: 2023-09-23

## 2023-07-01 NOTE — Progress Notes (Signed)
Established Patient Office Visit  Subjective:  Patient ID: Sean Anderson, male    DOB: March 10, 1981  Age: 42 y.o. MRN: [id number]  Chief Complaint  Patient presents with   Annual Exam    CPE w/lab results    No new complaints, here for CPE, lab review and medication refills. Labs reviewed and notable for uncontrolled diabetes, A1c not at target, LDL not at target with unremarkable cmp. Denies any hypoglycemic episodes and home bg readings have not been at target.     No other concerns at this time.   Past Medical History:  Diagnosis Date   Asthma    Diabetes mellitus without complication (HCC)     Past Surgical History:  Procedure Laterality Date   KNEE SURGERY      Social History   Socioeconomic History   Marital status: Married    Spouse name: Not on file   Number of children: 1   Years of education: Not on file   Highest education level: Associate degree: occupational, Scientist, product/process development, or vocational program  Occupational History   Occupation: Programmer, systems  Tobacco Use   Smoking status: Never   Smokeless tobacco: Never  Vaping Use   Vaping status: Never Used  Substance and Sexual Activity   Alcohol use: Yes    Alcohol/week: 4.0 standard drinks of alcohol    Types: 4 Cans of beer per week   Drug use: No   Sexual activity: Yes  Other Topics Concern   Not on file  Social History Narrative   Not on file   Social Determinants of Health   Financial Resource Strain: Not on file  Food Insecurity: Not on file  Transportation Needs: Not on file  Physical Activity: Not on file  Stress: Not on file  Social Connections: Not on file  Intimate Partner Violence: Not on file    Family History  Problem Relation Age of Onset   AAA (abdominal aortic aneurysm) Father    Stroke Father    Colon cancer Other     Allergies  Allergen Reactions   Banana    Penicillins Hives   Pineapple     Review of Systems  Constitutional:  Negative for weight loss.  HENT:  Negative.    Eyes: Negative.   Respiratory: Negative.    Cardiovascular: Negative.   Gastrointestinal: Negative.   Genitourinary: Negative.  Negative for frequency.  Skin: Negative.   Neurological: Negative.   Endo/Heme/Allergies: Negative.        Objective:   BP 110/80   Pulse 85   Ht 5\' 10"  (1.778 m)   Wt 170 lb 12.8 oz (77.5 kg)   SpO2 98%   BMI 24.51 kg/m   Vitals:   07/01/23 0841  BP: 110/80  Pulse: 85  Height: 5\' 10"  (1.778 m)  Weight: 170 lb 12.8 oz (77.5 kg)  SpO2: 98%  BMI (Calculated): 24.51    Physical Exam Vitals reviewed.  Constitutional:      Appearance: Normal appearance.  HENT:     Head: Normocephalic.     Left Ear: There is no impacted cerumen.     Nose: Nose normal.     Mouth/Throat:     Mouth: Mucous membranes are moist.     Pharynx: No posterior oropharyngeal erythema.  Eyes:     Extraocular Movements: Extraocular movements intact.     Pupils: Pupils are equal, round, and reactive to light.  Cardiovascular:     Rate and Rhythm: Regular rhythm.  Chest Wall: PMI is not displaced.     Pulses: Normal pulses.     Heart sounds: Normal heart sounds. No murmur heard. Pulmonary:     Effort: Pulmonary effort is normal.     Breath sounds: Normal air entry. No rhonchi or rales.  Abdominal:     General: Abdomen is flat. Bowel sounds are normal. There is no distension.     Palpations: Abdomen is soft. There is no hepatomegaly, splenomegaly or mass.     Tenderness: There is no abdominal tenderness.  Musculoskeletal:        General: Normal range of motion.     Cervical back: Normal range of motion and neck supple.     Right lower leg: No edema.     Left lower leg: No edema.  Skin:    General: Skin is warm and dry.  Neurological:     General: No focal deficit present.     Mental Status: He is alert and oriented to person, place, and time.     Cranial Nerves: No cranial nerve deficit.     Motor: No weakness.  Psychiatric:        Mood and  Affect: Mood normal.        Behavior: Behavior normal.      Results for orders placed or performed in visit on 07/01/23  POCT CBG (Fasting - Glucose)  Result Value Ref Range   Glucose Fasting, POC 149 (A) 70 - 99 mg/dL    Recent Results (from the past 2160 hour(Loukisha Gunnerson))  Lipid panel     Status: Abnormal   Collection Time: 06/29/23 11:41 AM  Result Value Ref Range   Cholesterol, Total 177 100 - 199 mg/dL   Triglycerides 469 0 - 149 mg/dL   HDL 51 >62 mg/dL   VLDL Cholesterol Cal 21 5 - 40 mg/dL   LDL Chol Calc (NIH) 952 (H) 0 - 99 mg/dL   Chol/HDL Ratio 3.5 0.0 - 5.0 ratio    Comment:                                   T. Chol/HDL Ratio                                             Men  Women                               1/2 Avg.Risk  3.4    3.3                                   Avg.Risk  5.0    4.4                                2X Avg.Risk  9.6    7.1                                3X Avg.Risk 23.4   11.0   Hemoglobin A1c     Status: Abnormal   Collection Time: 06/29/23 11:41 AM  Result  Value Ref Range   Hgb A1c MFr Bld 8.1 (H) 4.8 - 5.6 %    Comment:          Prediabetes: 5.7 - 6.4          Diabetes: >6.4          Glycemic control for adults with diabetes: <7.0    Est. average glucose Bld gHb Est-mCnc 186 mg/dL  Comprehensive metabolic panel     Status: Abnormal   Collection Time: 06/29/23 11:41 AM  Result Value Ref Range   Glucose 174 (H) 70 - 99 mg/dL   BUN 12 6 - 24 mg/dL   Creatinine, Ser 9.56 0.76 - 1.27 mg/dL   eGFR 213 >08 MV/HQI/6.96   BUN/Creatinine Ratio 13 9 - 20   Sodium 141 134 - 144 mmol/L   Potassium 5.1 3.5 - 5.2 mmol/L   Chloride 100 96 - 106 mmol/L   CO2 26 20 - 29 mmol/L   Calcium 10.1 8.7 - 10.2 mg/dL   Total Protein 7.1 6.0 - 8.5 g/dL   Albumin 4.8 4.1 - 5.1 g/dL   Globulin, Total 2.3 1.5 - 4.5 g/dL   Bilirubin Total 0.7 0.0 - 1.2 mg/dL   Alkaline Phosphatase 95 44 - 121 IU/L   AST 15 0 - 40 IU/L   ALT 24 0 - 44 IU/L  CBC With Diff/Platelet      Status: Abnormal   Collection Time: 06/29/23 11:41 AM  Result Value Ref Range   WBC 4.7 3.4 - 10.8 x10E3/uL   RBC 5.74 4.14 - 5.80 x10E6/uL   Hemoglobin 18.2 (H) 13.0 - 17.7 g/dL   Hematocrit 29.5 (H) 28.4 - 51.0 %   MCV 94 79 - 97 fL   MCH 31.7 26.6 - 33.0 pg   MCHC 33.8 31.5 - 35.7 g/dL   RDW 13.2 44.0 - 10.2 %   Platelets 204 150 - 450 x10E3/uL   Neutrophils 42 Not Estab. %   Lymphs 42 Not Estab. %   Monocytes 6 Not Estab. %   Eos 10 Not Estab. %   Basos 0 Not Estab. %   Neutrophils Absolute 1.9 1.4 - 7.0 x10E3/uL   Lymphocytes Absolute 2.0 0.7 - 3.1 x10E3/uL   Monocytes Absolute 0.3 0.1 - 0.9 x10E3/uL   EOS (ABSOLUTE) 0.5 (H) 0.0 - 0.4 x10E3/uL   Basophils Absolute 0.0 0.0 - 0.2 x10E3/uL   Immature Granulocytes 0 Not Estab. %   Immature Grans (Abs) 0.0 0.0 - 0.1 x10E3/uL  POCT CBG (Fasting - Glucose)     Status: Abnormal   Collection Time: 07/01/23  8:47 AM  Result Value Ref Range   Glucose Fasting, POC 149 (A) 70 - 99 mg/dL      Assessment & Plan:  As per problem list  Problem List Items Addressed This Visit       Digestive   Fatty liver disease, nonalcoholic   Other Visit Diagnoses     Type 2 diabetes mellitus without complication, without long-term current use of insulin (HCC)    -  Primary   Relevant Medications   FARXIGA 10 MG TABS tablet   pioglitazone (ACTOS) 15 MG tablet   metFORMIN (GLUCOPHAGE-XR) 500 MG 24 hr tablet   Other Relevant Orders   POCT CBG (Fasting - Glucose) (Completed)   POC CREATINE & ALBUMIN,URINE   Fructosamine       Return in about 6 weeks (around 08/12/2023).   Total time spent: 30 minutes  Luna Fuse, MD  07/01/2023   This document may have been prepared by Dragon Voice Recognition software and as such may include unintentional dictation errors.

## 2023-08-11 ENCOUNTER — Other Ambulatory Visit: Payer: Commercial Managed Care - HMO

## 2023-08-11 DIAGNOSIS — E119 Type 2 diabetes mellitus without complications: Secondary | ICD-10-CM

## 2023-08-12 ENCOUNTER — Ambulatory Visit (INDEPENDENT_AMBULATORY_CARE_PROVIDER_SITE_OTHER): Payer: Commercial Managed Care - HMO | Admitting: Internal Medicine

## 2023-08-12 ENCOUNTER — Encounter: Payer: Self-pay | Admitting: Internal Medicine

## 2023-08-12 VITALS — BP 112/75 | HR 85 | Ht 70.0 in | Wt 173.6 lb

## 2023-08-12 DIAGNOSIS — E1100 Type 2 diabetes mellitus with hyperosmolarity without nonketotic hyperglycemic-hyperosmolar coma (NKHHC): Secondary | ICD-10-CM | POA: Diagnosis not present

## 2023-08-12 DIAGNOSIS — K760 Fatty (change of) liver, not elsewhere classified: Secondary | ICD-10-CM

## 2023-08-12 DIAGNOSIS — K219 Gastro-esophageal reflux disease without esophagitis: Secondary | ICD-10-CM | POA: Insufficient documentation

## 2023-08-12 DIAGNOSIS — E119 Type 2 diabetes mellitus without complications: Secondary | ICD-10-CM

## 2023-08-12 LAB — POCT CBG (FASTING - GLUCOSE)-MANUAL ENTRY: Glucose Fasting, POC: 142 mg/dL — AB (ref 70–99)

## 2023-08-12 LAB — FRUCTOSAMINE: Fructosamine: 318 umol/L — ABNORMAL HIGH (ref 0–285)

## 2023-08-12 MED ORDER — PIOGLITAZONE HCL 30 MG PO TABS
30.0000 mg | ORAL_TABLET | Freq: Every day | ORAL | 1 refills | Status: DC
Start: 2023-08-12 — End: 2023-09-23

## 2023-08-12 MED ORDER — PANTOPRAZOLE SODIUM 40 MG PO TBEC
40.0000 mg | DELAYED_RELEASE_TABLET | Freq: Every day | ORAL | 1 refills | Status: DC
Start: 2023-08-12 — End: 2023-10-05

## 2023-08-12 NOTE — Progress Notes (Signed)
Established Patient Office Visit  Subjective:  Patient ID: Sean Anderson, male    DOB: 03/04/1981  Age: 42 y.o. MRN: [id number]  No chief complaint on file.   No new complaints except increasing frequency of dyspepsia and biilous regurgitation. Here for lab review and medication refills. Labs reviewed and notable for uncontrolled diabetes, fructosamine not at target. Denies any hypoglycemic episodes and home bg readings have not been at target.       No other concerns at this time.   Past Medical History:  Diagnosis Date   Asthma    Diabetes mellitus without complication (HCC)     Past Surgical History:  Procedure Laterality Date   KNEE SURGERY      Social History   Socioeconomic History   Marital status: Married    Spouse name: Not on file   Number of children: 1   Years of education: Not on file   Highest education level: Associate degree: occupational, Scientist, product/process development, or vocational program  Occupational History   Occupation: Programmer, systems  Tobacco Use   Smoking status: Never   Smokeless tobacco: Never  Vaping Use   Vaping status: Never Used  Substance and Sexual Activity   Alcohol use: Yes    Alcohol/week: 4.0 standard drinks of alcohol    Types: 4 Cans of beer per week   Drug use: No   Sexual activity: Yes  Other Topics Concern   Not on file  Social History Narrative   Not on file   Social Determinants of Health   Financial Resource Strain: Not on file  Food Insecurity: Not on file  Transportation Needs: Not on file  Physical Activity: Not on file  Stress: Not on file  Social Connections: Not on file  Intimate Partner Violence: Not on file    Family History  Problem Relation Age of Onset   AAA (abdominal aortic aneurysm) Father    Stroke Father    Colon cancer Other     Allergies  Allergen Reactions   Banana    Penicillins Hives   Pineapple     Review of Systems  Constitutional:  Negative for weight loss.  HENT: Negative.    Eyes:  Negative.   Respiratory: Negative.    Cardiovascular: Negative.   Gastrointestinal:  Positive for heartburn.  Genitourinary: Negative.  Negative for frequency.  Skin: Negative.   Neurological: Negative.   Endo/Heme/Allergies: Negative.        Objective:   BP 112/75   Pulse 85   Ht 5\' 10"  (1.778 m)   Wt 173 lb 9.6 oz (78.7 kg)   SpO2 99%   BMI 24.91 kg/m   Vitals:   08/12/23 0942  BP: 112/75  Pulse: 85  Height: 5\' 10"  (1.778 m)  Weight: 173 lb 9.6 oz (78.7 kg)  SpO2: 99%  BMI (Calculated): 24.91    Physical Exam Vitals reviewed.  Constitutional:      Appearance: Normal appearance.  HENT:     Head: Normocephalic.     Left Ear: There is no impacted cerumen.     Nose: Nose normal.     Mouth/Throat:     Mouth: Mucous membranes are moist.     Pharynx: No posterior oropharyngeal erythema.  Eyes:     Extraocular Movements: Extraocular movements intact.     Pupils: Pupils are equal, round, and reactive to light.  Cardiovascular:     Rate and Rhythm: Regular rhythm.     Chest Wall: PMI is not displaced.  Pulses: Normal pulses.     Heart sounds: Normal heart sounds. No murmur heard. Pulmonary:     Effort: Pulmonary effort is normal.     Breath sounds: Normal air entry. No rhonchi or rales.  Abdominal:     General: Abdomen is flat. Bowel sounds are normal. There is no distension.     Palpations: Abdomen is soft. There is no hepatomegaly, splenomegaly or mass.     Tenderness: There is no abdominal tenderness.  Musculoskeletal:        General: Normal range of motion.     Cervical back: Normal range of motion and neck supple.     Right lower leg: No edema.     Left lower leg: No edema.  Skin:    General: Skin is warm and dry.  Neurological:     General: No focal deficit present.     Mental Status: He is alert and oriented to person, place, and time.     Cranial Nerves: No cranial nerve deficit.     Motor: No weakness.  Psychiatric:        Mood and Affect:  Mood normal.        Behavior: Behavior normal.      Results for orders placed or performed in visit on 08/12/23  POCT CBG (Fasting - Glucose)  Result Value Ref Range   Glucose Fasting, POC 142 (A) 70 - 99 mg/dL    Recent Results (from the past 2160 hour(Saleah Rishel))  Lipid panel     Status: Abnormal   Collection Time: 06/29/23 11:41 AM  Result Value Ref Range   Cholesterol, Total 177 100 - 199 mg/dL   Triglycerides 191 0 - 149 mg/dL   HDL 51 >47 mg/dL   VLDL Cholesterol Cal 21 5 - 40 mg/dL   LDL Chol Calc (NIH) 829 (H) 0 - 99 mg/dL   Chol/HDL Ratio 3.5 0.0 - 5.0 ratio    Comment:                                   T. Chol/HDL Ratio                                             Men  Women                               1/2 Avg.Risk  3.4    3.3                                   Avg.Risk  5.0    4.4                                2X Avg.Risk  9.6    7.1                                3X Avg.Risk 23.4   11.0   Hemoglobin A1c     Status: Abnormal   Collection Time: 06/29/23 11:41 AM  Result Value Ref Range   Hgb A1c MFr Bld 8.1 (  H) 4.8 - 5.6 %    Comment:          Prediabetes: 5.7 - 6.4          Diabetes: >6.4          Glycemic control for adults with diabetes: <7.0    Est. average glucose Bld gHb Est-mCnc 186 mg/dL  Comprehensive metabolic panel     Status: Abnormal   Collection Time: 06/29/23 11:41 AM  Result Value Ref Range   Glucose 174 (H) 70 - 99 mg/dL   BUN 12 6 - 24 mg/dL   Creatinine, Ser 8.29 0.76 - 1.27 mg/dL   eGFR 562 >13 YQ/MVH/8.46   BUN/Creatinine Ratio 13 9 - 20   Sodium 141 134 - 144 mmol/L   Potassium 5.1 3.5 - 5.2 mmol/L   Chloride 100 96 - 106 mmol/L   CO2 26 20 - 29 mmol/L   Calcium 10.1 8.7 - 10.2 mg/dL   Total Protein 7.1 6.0 - 8.5 g/dL   Albumin 4.8 4.1 - 5.1 g/dL   Globulin, Total 2.3 1.5 - 4.5 g/dL   Bilirubin Total 0.7 0.0 - 1.2 mg/dL   Alkaline Phosphatase 95 44 - 121 IU/L   AST 15 0 - 40 IU/L   ALT 24 0 - 44 IU/L  CBC With Diff/Platelet      Status: Abnormal   Collection Time: 06/29/23 11:41 AM  Result Value Ref Range   WBC 4.7 3.4 - 10.8 x10E3/uL   RBC 5.74 4.14 - 5.80 x10E6/uL   Hemoglobin 18.2 (H) 13.0 - 17.7 g/dL   Hematocrit 96.2 (H) 95.2 - 51.0 %   MCV 94 79 - 97 fL   MCH 31.7 26.6 - 33.0 pg   MCHC 33.8 31.5 - 35.7 g/dL   RDW 84.1 32.4 - 40.1 %   Platelets 204 150 - 450 x10E3/uL   Neutrophils 42 Not Estab. %   Lymphs 42 Not Estab. %   Monocytes 6 Not Estab. %   Eos 10 Not Estab. %   Basos 0 Not Estab. %   Neutrophils Absolute 1.9 1.4 - 7.0 x10E3/uL   Lymphocytes Absolute 2.0 0.7 - 3.1 x10E3/uL   Monocytes Absolute 0.3 0.1 - 0.9 x10E3/uL   EOS (ABSOLUTE) 0.5 (H) 0.0 - 0.4 x10E3/uL   Basophils Absolute 0.0 0.0 - 0.2 x10E3/uL   Immature Granulocytes 0 Not Estab. %   Immature Grans (Abs) 0.0 0.0 - 0.1 x10E3/uL  POCT CBG (Fasting - Glucose)     Status: Abnormal   Collection Time: 07/01/23  8:47 AM  Result Value Ref Range   Glucose Fasting, POC 149 (A) 70 - 99 mg/dL  POC CREATINE & ALBUMIN,URINE     Status: None   Collection Time: 07/01/23  9:28 AM  Result Value Ref Range   Microalbumin Ur, POC 10 mg/L   Creatinine, POC 50 mg/dL   Albumin/Creatinine Ratio, Urine, POC <30   Fructosamine     Status: Abnormal   Collection Time: 08/11/23  9:36 AM  Result Value Ref Range   Fructosamine 318 (H) 0 - 285 umol/L    Comment: Published reference interval for apparently healthy subjects between age 59 and 72 is 79 - 285 umol/L and in a poorly controlled diabetic population is 228 - 563 umol/L with a mean of 396 umol/L.   POCT CBG (Fasting - Glucose)     Status: Abnormal   Collection Time: 08/12/23  9:47 AM  Result Value Ref Range   Glucose Fasting, POC 142 (  A) 70 - 99 mg/dL      Assessment & Plan:  As per problem list. Increase Pioglitazone. Problem List Items Addressed This Visit       Digestive   Fatty liver disease, nonalcoholic     Endocrine   Diabetes mellitus type 2 with hyperosmolarity, uncontrolled  (HCC)   Relevant Medications   pioglitazone (ACTOS) 30 MG tablet   Other Relevant Orders   POCT CBG (Fasting - Glucose) (Completed)   Other Visit Diagnoses     Type 2 diabetes mellitus without complication, without long-term current use of insulin (HCC)    -  Primary   Relevant Medications   pioglitazone (ACTOS) 30 MG tablet   Other Relevant Orders   Lipid panel   Hemoglobin A1c   Gastroesophageal reflux disease without esophagitis       Relevant Medications   pantoprazole (PROTONIX) 40 MG tablet       Return in about 6 weeks (around 09/23/2023) for fu with labs prior.   Total time spent: 30 minutes  Luna Fuse, MD  08/12/2023   This document may have been prepared by [facility] Voice Recognition software and as such may include unintentional dictation errors.

## 2023-09-22 ENCOUNTER — Other Ambulatory Visit: Payer: Commercial Managed Care - HMO

## 2023-09-22 DIAGNOSIS — E119 Type 2 diabetes mellitus without complications: Secondary | ICD-10-CM

## 2023-09-23 ENCOUNTER — Ambulatory Visit (INDEPENDENT_AMBULATORY_CARE_PROVIDER_SITE_OTHER): Payer: Commercial Managed Care - HMO | Admitting: Internal Medicine

## 2023-09-23 VITALS — BP 110/80 | HR 77 | Ht 70.0 in | Wt 174.0 lb

## 2023-09-23 DIAGNOSIS — Z0130 Encounter for examination of blood pressure without abnormal findings: Secondary | ICD-10-CM

## 2023-09-23 DIAGNOSIS — S43421A Sprain of right rotator cuff capsule, initial encounter: Secondary | ICD-10-CM

## 2023-09-23 DIAGNOSIS — E119 Type 2 diabetes mellitus without complications: Secondary | ICD-10-CM | POA: Diagnosis not present

## 2023-09-23 LAB — LIPID PANEL
Chol/HDL Ratio: 3.6 {ratio} (ref 0.0–5.0)
Cholesterol, Total: 194 mg/dL (ref 100–199)
HDL: 54 mg/dL (ref >39)
LDL Chol Calc (NIH): 116 mg/dL — ABNORMAL HIGH (ref 0–99)
Triglycerides: 135 mg/dL (ref 0–149)
VLDL Cholesterol Cal: 24 mg/dL (ref 5–40)

## 2023-09-23 LAB — HEMOGLOBIN A1C
Est. average glucose Bld gHb Est-mCnc: 174 mg/dL
Hgb A1c MFr Bld: 7.7 % — ABNORMAL HIGH (ref 4.8–5.6)

## 2023-09-23 LAB — POCT CBG (FASTING - GLUCOSE)-MANUAL ENTRY: Glucose Fasting, POC: 122 mg/dL — AB (ref 70–99)

## 2023-09-23 MED ORDER — METFORMIN HCL ER 500 MG PO TB24
1000.0000 mg | ORAL_TABLET | Freq: Two times a day (BID) | ORAL | 0 refills | Status: DC
Start: 2023-09-23 — End: 2023-12-23

## 2023-09-23 MED ORDER — PIOGLITAZONE HCL 45 MG PO TABS
45.0000 mg | ORAL_TABLET | Freq: Every day | ORAL | 1 refills | Status: DC
Start: 2023-09-23 — End: 2023-12-23

## 2023-09-23 MED ORDER — FARXIGA 10 MG PO TABS
10.0000 mg | ORAL_TABLET | Freq: Every morning | ORAL | 0 refills | Status: DC
Start: 2023-09-23 — End: 2024-03-23

## 2023-09-23 MED ORDER — IBUPROFEN 800 MG PO TABS: 800.0000 mg | ORAL_TABLET | Freq: Three times a day (TID) | ORAL | 1 refills | Status: AC | PRN

## 2023-09-23 NOTE — Progress Notes (Signed)
Established Patient Office Visit  Subjective:  Patient ID: Sean Anderson, male    DOB: [birth year]  Age: 42 y.o. MRN: [id number]  Chief Complaint  Patient presents with   Follow-up    6 week f/u    No new complaints, here for lab review and medication refills. Labs reviewed and notable for well controlled diabetes A1c still not  at target. Denies any hypoglycemic episodes and home bg readings have been at target. Medication compliance has improved. C/o 3 mo of R shoulder pain which is moderate-severe exacerbated by lying on that side and denies trauma.     No other concerns at this time.   Past Medical History:  Diagnosis Date   Asthma    Diabetes mellitus without complication (HCC)     Past Surgical History:  Procedure Laterality Date   KNEE SURGERY      Social History   Socioeconomic History   Marital status: Married    Spouse name: Not on file   Number of children: 1   Years of education: Not on file   Highest education level: Associate degree: occupational, Scientist, product/process development, or vocational program  Occupational History   Occupation: Programmer, systems  Tobacco Use   Smoking status: Never   Smokeless tobacco: Never  Vaping Use   Vaping status: Never Used  Substance and Sexual Activity   Alcohol use: Yes    Alcohol/week: 4.0 standard drinks of alcohol    Types: 4 Cans of beer per week   Drug use: No   Sexual activity: Yes  Other Topics Concern   Not on file  Social History Narrative   Not on file   Social Determinants of Health   Financial Resource Strain: Not on file  Food Insecurity: Not on file  Transportation Needs: Not on file  Physical Activity: Not on file  Stress: Not on file  Social Connections: Not on file  Intimate Partner Violence: Not on file    Family History  Problem Relation Age of Onset   AAA (abdominal aortic aneurysm) Father    Stroke Father    Colon cancer Other     Allergies  Allergen Reactions   Banana    Penicillins Hives    Pineapple     Review of Systems  Constitutional:  Negative for weight loss.  HENT: Negative.    Eyes: Negative.   Respiratory: Negative.    Cardiovascular: Negative.   Gastrointestinal:  Positive for heartburn.  Genitourinary: Negative.  Negative for frequency.  Musculoskeletal:  Positive for joint pain.  Skin: Negative.   Neurological: Negative.   Endo/Heme/Allergies: Negative.        Objective:   BP 110/80   Pulse 77   Ht 5\' 10"  (1.778 m)   Wt 174 lb (78.9 kg)   SpO2 97%   BMI 24.97 kg/m   Vitals:   09/23/23 0840  BP: 110/80  Pulse: 77  Height: 5\' 10"  (1.778 m)  Weight: 174 lb (78.9 kg)  SpO2: 97%  BMI (Calculated): 24.97    Physical Exam Vitals reviewed.  Constitutional:      Appearance: Normal appearance.  HENT:     Head: Normocephalic.     Left Ear: There is no impacted cerumen.     Nose: Nose normal.     Mouth/Throat:     Mouth: Mucous membranes are moist.     Pharynx: No posterior oropharyngeal erythema.  Eyes:     Extraocular Movements: Extraocular movements intact.     Pupils: Pupils are  equal, round, and reactive to light.  Cardiovascular:     Rate and Rhythm: Regular rhythm.     Chest Wall: PMI is not displaced.     Pulses: Normal pulses.     Heart sounds: Normal heart sounds. No murmur heard. Pulmonary:     Effort: Pulmonary effort is normal.     Breath sounds: Normal air entry. No rhonchi or rales.  Abdominal:     General: Abdomen is flat. Bowel sounds are normal. There is no distension.     Palpations: Abdomen is soft. There is no hepatomegaly, splenomegaly or mass.     Tenderness: There is no abdominal tenderness.  Musculoskeletal:     Right shoulder: No tenderness. Decreased range of motion.     Cervical back: Normal range of motion and neck supple.     Right lower leg: No edema.     Left lower leg: No edema.  Skin:    General: Skin is warm and dry.  Neurological:     General: No focal deficit present.     Mental Status: He is  alert and oriented to person, place, and time.     Cranial Nerves: No cranial nerve deficit.     Motor: No weakness.  Psychiatric:        Mood and Affect: Mood normal.        Behavior: Behavior normal.      Results for orders placed or performed in visit on 09/23/23  POCT CBG (Fasting - Glucose)  Result Value Ref Range   Glucose Fasting, POC 122 (A) 70 - 99 mg/dL    Recent Results (from the past 2160 hour(Sean Anderson))  Lipid panel     Status: Abnormal   Collection Time: 06/29/23 11:41 AM  Result Value Ref Range   Cholesterol, Total 177 100 - 199 mg/dL   Triglycerides 161 0 - 149 mg/dL   HDL 51 >09 mg/dL   VLDL Cholesterol Cal 21 5 - 40 mg/dL   LDL Chol Calc (NIH) 604 (H) 0 - 99 mg/dL   Chol/HDL Ratio 3.5 0.0 - 5.0 ratio    Comment:                                   T. Chol/HDL Ratio                                             Men  Women                               1/2 Avg.Risk  3.4    3.3                                   Avg.Risk  5.0    4.4                                2X Avg.Risk  9.6    7.1                                3X Avg.Risk 23.4  11.0   Hemoglobin A1c     Status: Abnormal   Collection Time: 06/29/23 11:41 AM  Result Value Ref Range   Hgb A1c MFr Bld 8.1 (H) 4.8 - 5.6 %    Comment:          Prediabetes: 5.7 - 6.4          Diabetes: >6.4          Glycemic control for adults with diabetes: <7.0    Est. average glucose Bld gHb Est-mCnc 186 mg/dL  Comprehensive metabolic panel     Status: Abnormal   Collection Time: 06/29/23 11:41 AM  Result Value Ref Range   Glucose 174 (H) 70 - 99 mg/dL   BUN 12 6 - 24 mg/dL   Creatinine, Ser 2.72 0.76 - 1.27 mg/dL   eGFR 536 >64 QI/HKV/4.25   BUN/Creatinine Ratio 13 9 - 20   Sodium 141 134 - 144 mmol/L   Potassium 5.1 3.5 - 5.2 mmol/L   Chloride 100 96 - 106 mmol/L   CO2 26 20 - 29 mmol/L   Calcium 10.1 8.7 - 10.2 mg/dL   Total Protein 7.1 6.0 - 8.5 g/dL   Albumin 4.8 4.1 - 5.1 g/dL   Globulin, Total 2.3 1.5 - 4.5 g/dL    Bilirubin Total 0.7 0.0 - 1.2 mg/dL   Alkaline Phosphatase 95 44 - 121 IU/L   AST 15 0 - 40 IU/L   ALT 24 0 - 44 IU/L  CBC With Diff/Platelet     Status: Abnormal   Collection Time: 06/29/23 11:41 AM  Result Value Ref Range   WBC 4.7 3.4 - 10.8 x10E3/uL   RBC 5.74 4.14 - 5.80 x10E6/uL   Hemoglobin 18.2 (H) 13.0 - 17.7 g/dL   Hematocrit 95.6 (H) 38.7 - 51.0 %   MCV 94 79 - 97 fL   MCH 31.7 26.6 - 33.0 pg   MCHC 33.8 31.5 - 35.7 g/dL   RDW 56.4 33.2 - 95.1 %   Platelets 204 150 - 450 x10E3/uL   Neutrophils 42 Not Estab. %   Lymphs 42 Not Estab. %   Monocytes 6 Not Estab. %   Eos 10 Not Estab. %   Basos 0 Not Estab. %   Neutrophils Absolute 1.9 1.4 - 7.0 x10E3/uL   Lymphocytes Absolute 2.0 0.7 - 3.1 x10E3/uL   Monocytes Absolute 0.3 0.1 - 0.9 x10E3/uL   EOS (ABSOLUTE) 0.5 (H) 0.0 - 0.4 x10E3/uL   Basophils Absolute 0.0 0.0 - 0.2 x10E3/uL   Immature Granulocytes 0 Not Estab. %   Immature Grans (Abs) 0.0 0.0 - 0.1 x10E3/uL  POCT CBG (Fasting - Glucose)     Status: Abnormal   Collection Time: 07/01/23  8:47 AM  Result Value Ref Range   Glucose Fasting, POC 149 (A) 70 - 99 mg/dL  POC CREATINE & ALBUMIN,URINE     Status: None   Collection Time: 07/01/23  9:28 AM  Result Value Ref Range   Microalbumin Ur, POC 10 mg/L   Creatinine, POC 50 mg/dL   Albumin/Creatinine Ratio, Urine, POC <30   Fructosamine     Status: Abnormal   Collection Time: 08/11/23  9:36 AM  Result Value Ref Range   Fructosamine 318 (H) 0 - 285 umol/L    Comment: Published reference interval for apparently healthy subjects between age 4 and 41 is 2 - 285 umol/L and in a poorly controlled diabetic population is 228 - 563 umol/L with a mean of 396 umol/L.  POCT CBG (Fasting - Glucose)     Status: Abnormal   Collection Time: 08/12/23  9:47 AM  Result Value Ref Range   Glucose Fasting, POC 142 (A) 70 - 99 mg/dL  Lipid panel     Status: Abnormal   Collection Time: 09/22/23  8:55 AM  Result Value Ref  Range   Cholesterol, Total 194 100 - 199 mg/dL   Triglycerides 643 0 - 149 mg/dL   HDL 54 >32 mg/dL   VLDL Cholesterol Cal 24 5 - 40 mg/dL   LDL Chol Calc (NIH) 951 (H) 0 - 99 mg/dL   Chol/HDL Ratio 3.6 0.0 - 5.0 ratio    Comment:                                   T. Chol/HDL Ratio                                             Men  Women                               1/2 Avg.Risk  3.4    3.3                                   Avg.Risk  5.0    4.4                                2X Avg.Risk  9.6    7.1                                3X Avg.Risk 23.4   11.0   Hemoglobin A1c     Status: Abnormal   Collection Time: 09/22/23  9:00 AM  Result Value Ref Range   Hgb A1c MFr Bld 7.7 (H) 4.8 - 5.6 %    Comment:          Prediabetes: 5.7 - 6.4          Diabetes: >6.4          Glycemic control for adults with diabetes: <7.0    Est. average glucose Bld gHb Est-mCnc 174 mg/dL  POCT CBG (Fasting - Glucose)     Status: Abnormal   Collection Time: 09/23/23  8:46 AM  Result Value Ref Range   Glucose Fasting, POC 122 (A) 70 - 99 mg/dL      Assessment & Plan:  As per problem list. Increase pioglitazone.  Problem List Items Addressed This Visit       Endocrine   Type 2 diabetes mellitus without complication, without long-term current use of insulin (HCC) - Primary   Relevant Medications   pioglitazone (ACTOS) 45 MG tablet   Other Relevant Orders   POCT CBG (Fasting - Glucose) (Completed)    Return in about 3 months (around 12/24/2023) for fu with labs prior.   Total time spent: 20 minutes  Luna Fuse, MD  09/23/2023   This document may have been prepared by [facility] Voice Recognition software and as such may include unintentional dictation  errors.

## 2023-09-29 ENCOUNTER — Other Ambulatory Visit: Payer: Commercial Managed Care - HMO

## 2023-10-04 ENCOUNTER — Other Ambulatory Visit: Payer: Self-pay | Admitting: Internal Medicine

## 2023-10-04 DIAGNOSIS — K219 Gastro-esophageal reflux disease without esophagitis: Secondary | ICD-10-CM

## 2023-10-04 DIAGNOSIS — E1100 Type 2 diabetes mellitus with hyperosmolarity without nonketotic hyperglycemic-hyperosmolar coma (NKHHC): Secondary | ICD-10-CM

## 2023-12-21 ENCOUNTER — Other Ambulatory Visit: Payer: Commercial Managed Care - HMO

## 2023-12-21 DIAGNOSIS — E669 Obesity, unspecified: Secondary | ICD-10-CM

## 2023-12-21 DIAGNOSIS — E119 Type 2 diabetes mellitus without complications: Secondary | ICD-10-CM

## 2023-12-21 DIAGNOSIS — I10 Essential (primary) hypertension: Secondary | ICD-10-CM

## 2023-12-22 LAB — CMP14+EGFR
ALT: 22 [IU]/L (ref 0–44)
AST: 14 [IU]/L (ref 0–40)
Albumin: 4.3 g/dL (ref 4.1–5.1)
Alkaline Phosphatase: 72 [IU]/L (ref 44–121)
BUN/Creatinine Ratio: 16 (ref 9–20)
BUN: 16 mg/dL (ref 6–24)
Bilirubin Total: 0.3 mg/dL (ref 0.0–1.2)
CO2: 24 mmol/L (ref 20–29)
Calcium: 9.6 mg/dL (ref 8.7–10.2)
Chloride: 103 mmol/L (ref 96–106)
Creatinine, Ser: 1.01 mg/dL (ref 0.76–1.27)
Globulin, Total: 2.2 g/dL (ref 1.5–4.5)
Glucose: 155 mg/dL — ABNORMAL HIGH (ref 70–99)
Potassium: 4.9 mmol/L (ref 3.5–5.2)
Sodium: 141 mmol/L (ref 134–144)
Total Protein: 6.5 g/dL (ref 6.0–8.5)
eGFR: 95 mL/min/{1.73_m2} (ref >59)

## 2023-12-22 LAB — LIPID PANEL
Chol/HDL Ratio: 3.3 {ratio} (ref 0.0–5.0)
Cholesterol, Total: 166 mg/dL (ref 100–199)
HDL: 51 mg/dL (ref >39)
LDL Chol Calc (NIH): 95 mg/dL (ref 0–99)
Triglycerides: 110 mg/dL (ref 0–149)
VLDL Cholesterol Cal: 20 mg/dL (ref 5–40)

## 2023-12-22 LAB — HEMOGLOBIN A1C
Est. average glucose Bld gHb Est-mCnc: 160 mg/dL
Hgb A1c MFr Bld: 7.2 % — ABNORMAL HIGH (ref 4.8–5.6)

## 2023-12-22 LAB — TSH: TSH: 2.55 u[IU]/mL (ref 0.450–4.500)

## 2023-12-23 ENCOUNTER — Ambulatory Visit: Payer: Commercial Managed Care - HMO | Admitting: Internal Medicine

## 2023-12-23 ENCOUNTER — Encounter: Payer: Self-pay | Admitting: Internal Medicine

## 2023-12-23 VITALS — BP 132/72 | HR 95 | Temp 96.3°F | Ht 69.0 in | Wt 174.4 lb

## 2023-12-23 DIAGNOSIS — Z0131 Encounter for examination of blood pressure with abnormal findings: Secondary | ICD-10-CM

## 2023-12-23 DIAGNOSIS — K219 Gastro-esophageal reflux disease without esophagitis: Secondary | ICD-10-CM | POA: Diagnosis not present

## 2023-12-23 DIAGNOSIS — E119 Type 2 diabetes mellitus without complications: Secondary | ICD-10-CM | POA: Diagnosis not present

## 2023-12-23 LAB — POCT CBG (FASTING - GLUCOSE)-MANUAL ENTRY: Glucose Fasting, POC: 144 mg/dL — AB (ref 70–99)

## 2023-12-23 MED ORDER — PANTOPRAZOLE SODIUM 40 MG PO TBEC: 40.0000 mg | DELAYED_RELEASE_TABLET | Freq: Every day | ORAL | 2 refills | Status: DC

## 2023-12-23 MED ORDER — METFORMIN HCL ER 750 MG PO TB24: ORAL_TABLET | ORAL | 0 refills | Status: DC

## 2023-12-23 MED ORDER — PIOGLITAZONE HCL 45 MG PO TABS: 45.0000 mg | ORAL_TABLET | Freq: Every day | ORAL | 1 refills | Status: DC

## 2023-12-23 MED ORDER — FARXIGA 10 MG PO TABS: 10.0000 mg | ORAL_TABLET | Freq: Every morning | ORAL | 0 refills | Status: DC

## 2023-12-23 NOTE — Progress Notes (Signed)
Established Patient Office Visit  Subjective:  Patient ID: Sean Anderson, male    DOB: 1981-05-19  Age: 43 y.o. MRN: [id number]  No chief complaint on file.   No new complaints, here for lab review and medication refills. Labs reviewed and notable for improved but uncontrolled diabetes, A1c not at target, lipids at target with unremarkable cmp. Denies any hypoglycemic episodes but home bg readings have been high.    No other concerns at this time.   Past Medical History:  Diagnosis Date   Asthma    Diabetes mellitus without complication (HCC)     Past Surgical History:  Procedure Laterality Date   KNEE SURGERY      Social History   Socioeconomic History   Marital status: Married    Spouse name: Not on file   Number of children: 1   Years of education: Not on file   Highest education level: Associate degree: occupational, Scientist, product/process development, or vocational program  Occupational History   Occupation: Programmer, systems  Tobacco Use   Smoking status: Never   Smokeless tobacco: Never  Vaping Use   Vaping status: Never Used  Substance and Sexual Activity   Alcohol use: Yes    Alcohol/week: 4.0 standard drinks of alcohol    Types: 4 Cans of beer per week   Drug use: No   Sexual activity: Yes  Other Topics Concern   Not on file  Social History Narrative   Not on file   Social Drivers of Health   Financial Resource Strain: Not on file  Food Insecurity: Not on file  Transportation Needs: Not on file  Physical Activity: Not on file  Stress: Not on file  Social Connections: Not on file  Intimate Partner Violence: Not on file    Family History  Problem Relation Age of Onset   AAA (abdominal aortic aneurysm) Father    Stroke Father    Colon cancer Other     Allergies  Allergen Reactions   Banana    Penicillins Hives   Pineapple     Outpatient Medications Prior to Visit  Medication Sig   FARXIGA 10 MG TABS tablet Take 10 mg by mouth every morning.   pantoprazole  (PROTONIX) 40 MG tablet Take 1 tablet by mouth once daily   pioglitazone (ACTOS) 45 MG tablet Take 1 tablet (45 mg total) by mouth daily.   sildenafil (VIAGRA) 100 MG tablet Take 0.5-1 tablets (50-100 mg total) by mouth daily as needed.   [DISCONTINUED] metFORMIN (GLUCOPHAGE-XR) 500 MG 24 hr tablet Take 2 tablets (1,000 mg total) by mouth 2 (two) times daily.   ondansetron (ZOFRAN ODT) 4 MG disintegrating tablet Take 1 tablet (4 mg total) by mouth every 8 (eight) hours as needed for nausea or vomiting. (Patient not taking: Reported on 07/01/2023)   No facility-administered medications prior to visit.    Review of Systems  Constitutional:  Negative for weight loss.  HENT: Negative.    Eyes: Negative.   Respiratory: Negative.    Cardiovascular: Negative.   Gastrointestinal:  Positive for heartburn (improving).  Genitourinary: Negative.  Negative for frequency.  Musculoskeletal:  Positive for joint pain.  Skin: Negative.   Neurological: Negative.   Endo/Heme/Allergies: Negative.        Objective:   BP 132/72   Pulse 95   Temp (!) 96.3 F (35.7 C)   Ht 5\' 9"  (1.753 m)   Wt 174 lb 6.4 oz (79.1 kg)   SpO2 99%   BMI 25.75 kg/m  Vitals:   12/23/23 0900  BP: 132/72  Pulse: 95  Temp: (!) 96.3 F (35.7 C)  Height: 5\' 9"  (1.753 m)  Weight: 174 lb 6.4 oz (79.1 kg)  SpO2: 99%  BMI (Calculated): 25.74    Physical Exam Vitals reviewed.  Constitutional:      Appearance: Normal appearance.  HENT:     Head: Normocephalic.     Left Ear: There is no impacted cerumen.     Nose: Nose normal.     Mouth/Throat:     Mouth: Mucous membranes are moist.     Pharynx: No posterior oropharyngeal erythema.  Eyes:     Extraocular Movements: Extraocular movements intact.     Pupils: Pupils are equal, round, and reactive to light.  Cardiovascular:     Rate and Rhythm: Regular rhythm.     Chest Wall: PMI is not displaced.     Pulses: Normal pulses.     Heart sounds: Normal heart sounds.  No murmur heard. Pulmonary:     Effort: Pulmonary effort is normal.     Breath sounds: Normal air entry. No rhonchi or rales.  Abdominal:     General: Abdomen is flat. Bowel sounds are normal. There is no distension.     Palpations: Abdomen is soft. There is no hepatomegaly, splenomegaly or mass.     Tenderness: There is no abdominal tenderness.  Musculoskeletal:     Right shoulder: No tenderness. Decreased range of motion.     Cervical back: Normal range of motion and neck supple.     Right lower leg: No edema.     Left lower leg: No edema.  Skin:    General: Skin is warm and dry.  Neurological:     General: No focal deficit present.     Mental Status: He is alert and oriented to person, place, and time.     Cranial Nerves: No cranial nerve deficit.     Motor: No weakness.  Psychiatric:        Mood and Affect: Mood normal.        Behavior: Behavior normal.      Results for orders placed or performed in visit on 12/23/23  POCT CBG (Fasting - Glucose)  Result Value Ref Range   Glucose Fasting, POC 144 (A) 70 - 99 mg/dL    Recent Results (from the past 2160 hours)  Hemoglobin A1c     Status: Abnormal   Collection Time: 12/21/23  8:58 AM  Result Value Ref Range   Hgb A1c MFr Bld 7.2 (H) 4.8 - 5.6 %    Comment:          Prediabetes: 5.7 - 6.4          Diabetes: >6.4          Glycemic control for adults with diabetes: <7.0    Est. average glucose Bld gHb Est-mCnc 160 mg/dL  TSH     Status: None   Collection Time: 12/21/23  8:58 AM  Result Value Ref Range   TSH 2.550 0.450 - 4.500 uIU/mL  CMP14+EGFR     Status: Abnormal   Collection Time: 12/21/23  8:58 AM  Result Value Ref Range   Glucose 155 (H) 70 - 99 mg/dL   BUN 16 6 - 24 mg/dL   Creatinine, Ser 0.86 0.76 - 1.27 mg/dL   eGFR 95 >57 QI/ONG/2.95   BUN/Creatinine Ratio 16 9 - 20   Sodium 141 134 - 144 mmol/L   Potassium 4.9 3.5 -  5.2 mmol/L   Chloride 103 96 - 106 mmol/L   CO2 24 20 - 29 mmol/L   Calcium 9.6 8.7  - 10.2 mg/dL   Total Protein 6.5 6.0 - 8.5 g/dL   Albumin 4.3 4.1 - 5.1 g/dL   Globulin, Total 2.2 1.5 - 4.5 g/dL   Bilirubin Total 0.3 0.0 - 1.2 mg/dL   Alkaline Phosphatase 72 44 - 121 IU/L   AST 14 0 - 40 IU/L   ALT 22 0 - 44 IU/L  Lipid panel     Status: None   Collection Time: 12/21/23  8:58 AM  Result Value Ref Range   Cholesterol, Total 166 100 - 199 mg/dL   Triglycerides 660 0 - 149 mg/dL   HDL 51 >63 mg/dL   VLDL Cholesterol Cal 20 5 - 40 mg/dL   LDL Chol Calc (NIH) 95 0 - 99 mg/dL   Chol/HDL Ratio 3.3 0.0 - 5.0 ratio    Comment:                                   T. Chol/HDL Ratio                                             Men  Women                               1/2 Avg.Risk  3.4    3.3                                   Avg.Risk  5.0    4.4                                2X Avg.Risk  9.6    7.1                                3X Avg.Risk 23.4   11.0   POCT CBG (Fasting - Glucose)     Status: Abnormal   Collection Time: 12/23/23  9:02 AM  Result Value Ref Range   Glucose Fasting, POC 144 (A) 70 - 99 mg/dL      Assessment & Plan:  As per problem list. Increase Metformin  strength to 750 mg  and dose to 2.25 g/day Problem List Items Addressed This Visit       Digestive   Gastroesophageal reflux disease without esophagitis     Endocrine   Type 2 diabetes mellitus without complication, without long-term current use of insulin (HCC) - Primary   Relevant Medications   FARXIGA 10 MG TABS tablet   Other Relevant Orders   POCT CBG (Fasting - Glucose) (Completed)    Return in about 3 months (around 03/22/2024) for fu with labs prior.   Total time spent: 20 minutes  Luna Fuse, MD  12/23/2023   This document may have been prepared by [facility] Voice Recognition software and as such may include unintentional dictation errors.

## 2024-02-27 ENCOUNTER — Other Ambulatory Visit: Payer: Self-pay | Admitting: Internal Medicine

## 2024-02-27 DIAGNOSIS — E119 Type 2 diabetes mellitus without complications: Secondary | ICD-10-CM

## 2024-03-21 ENCOUNTER — Other Ambulatory Visit

## 2024-03-21 DIAGNOSIS — E119 Type 2 diabetes mellitus without complications: Secondary | ICD-10-CM

## 2024-03-21 DIAGNOSIS — E785 Hyperlipidemia, unspecified: Secondary | ICD-10-CM

## 2024-03-21 DIAGNOSIS — I10 Essential (primary) hypertension: Secondary | ICD-10-CM

## 2024-03-22 LAB — CMP14+EGFR
ALT: 26 IU/L (ref 0–44)
AST: 15 IU/L (ref 0–40)
Albumin: 4.4 g/dL (ref 4.1–5.1)
Alkaline Phosphatase: 79 IU/L (ref 44–121)
BUN/Creatinine Ratio: 17 (ref 9–20)
BUN: 16 mg/dL (ref 6–24)
Bilirubin Total: 0.6 mg/dL (ref 0.0–1.2)
CO2: 22 mmol/L (ref 20–29)
Calcium: 8.9 mg/dL (ref 8.7–10.2)
Chloride: 104 mmol/L (ref 96–106)
Creatinine, Ser: 0.94 mg/dL (ref 0.76–1.27)
Globulin, Total: 1.8 g/dL (ref 1.5–4.5)
Glucose: 125 mg/dL — ABNORMAL HIGH (ref 70–99)
Potassium: 4.4 mmol/L (ref 3.5–5.2)
Sodium: 140 mmol/L (ref 134–144)
Total Protein: 6.2 g/dL (ref 6.0–8.5)
eGFR: 104 mL/min/{1.73_m2} (ref >59)

## 2024-03-22 LAB — TSH: TSH: 1.42 u[IU]/mL (ref 0.450–4.500)

## 2024-03-22 LAB — HEMOGLOBIN A1C
Est. average glucose Bld gHb Est-mCnc: 166 mg/dL
Hgb A1c MFr Bld: 7.4 % — ABNORMAL HIGH (ref 4.8–5.6)

## 2024-03-22 LAB — LIPID PANEL
Chol/HDL Ratio: 2.9 ratio (ref 0.0–5.0)
Cholesterol, Total: 160 mg/dL (ref 100–199)
HDL: 55 mg/dL (ref >39)
LDL Chol Calc (NIH): 90 mg/dL (ref 0–99)
Triglycerides: 82 mg/dL (ref 0–149)
VLDL Cholesterol Cal: 15 mg/dL (ref 5–40)

## 2024-03-23 ENCOUNTER — Ambulatory Visit: Payer: Commercial Managed Care - HMO | Admitting: Internal Medicine

## 2024-03-23 ENCOUNTER — Encounter: Payer: Self-pay | Admitting: Internal Medicine

## 2024-03-23 VITALS — BP 102/70 | HR 81 | Temp 94.8°F | Ht 69.0 in | Wt 177.2 lb

## 2024-03-23 DIAGNOSIS — J301 Allergic rhinitis due to pollen: Secondary | ICD-10-CM | POA: Diagnosis not present

## 2024-03-23 DIAGNOSIS — E119 Type 2 diabetes mellitus without complications: Secondary | ICD-10-CM

## 2024-03-23 DIAGNOSIS — Z0130 Encounter for examination of blood pressure without abnormal findings: Secondary | ICD-10-CM

## 2024-03-23 LAB — GLUCOSE, POCT (MANUAL RESULT ENTRY): POC Glucose: 124 mg/dL — AB (ref 70–99)

## 2024-03-23 MED ORDER — FARXIGA 10 MG PO TABS: 10.0000 mg | ORAL_TABLET | Freq: Every morning | ORAL | 0 refills | Status: DC

## 2024-03-23 NOTE — Progress Notes (Signed)
 Established Patient Office Visit  Subjective:  Patient ID: Sean Anderson, male    DOB: Aug 17, 1981  Age: 43 y.o. MRN: [id number]  Chief Complaint  Patient presents with   Follow-up    3 month follow up    No new complaints, here for lab review and medication refills. Labs reviewed and notable for uncontrolled diabetes, A1c not at target, lipids at target with unremarkable cmp. Denies any hypoglycemic episodes but has not checked his bg recently. Admits to poor compliance with meds due to financial issues. Flare of seasonal allergies which is now subsiding.    No other concerns at this time.   Past Medical History:  Diagnosis Date   Asthma    Diabetes mellitus without complication (HCC)     Past Surgical History:  Procedure Laterality Date   KNEE SURGERY      Social History   Socioeconomic History   Marital status: Married    Spouse name: Not on file   Number of children: 1   Years of education: Not on file   Highest education level: Associate degree: occupational, Scientist, product/process development, or vocational program  Occupational History   Occupation: Programmer, systems  Tobacco Use   Smoking status: Never   Smokeless tobacco: Never  Vaping Use   Vaping status: Never Used  Substance and Sexual Activity   Alcohol use: Yes    Alcohol/week: 4.0 standard drinks of alcohol    Types: 4 Cans of beer per week   Drug use: No   Sexual activity: Yes  Other Topics Concern   Not on file  Social History Narrative   Not on file   Social Drivers of Health   Financial Resource Strain: Not on file  Food Insecurity: Not on file  Transportation Needs: Not on file  Physical Activity: Not on file  Stress: Not on file  Social Connections: Not on file  Intimate Partner Violence: Not on file    Family History  Problem Relation Age of Onset   AAA (abdominal aortic aneurysm) Father    Stroke Father    Colon cancer Other     Allergies  Allergen Reactions   Banana    Penicillins Hives    Pineapple     Outpatient Medications Prior to Visit  Medication Sig   metFORMIN  (GLUCOPHAGE -XR) 750 MG 24 hr tablet Take 2 tabs in the am and 1 in the pm   pantoprazole  (PROTONIX ) 40 MG tablet Take 1 tablet (40 mg total) by mouth daily.   pioglitazone  (ACTOS ) 45 MG tablet Take 1 tablet (45 mg total) by mouth daily.   sildenafil  (VIAGRA ) 100 MG tablet Take 0.5-1 tablets (50-100 mg total) by mouth daily as needed.   ondansetron  (ZOFRAN  ODT) 4 MG disintegrating tablet Take 1 tablet (4 mg total) by mouth every 8 (eight) hours as needed for nausea or vomiting. (Patient not taking: Reported on 03/23/2024)   No facility-administered medications prior to visit.    Review of Systems  Constitutional:  Negative for weight loss.  HENT:  Positive for congestion.   Eyes: Negative.   Respiratory: Negative.    Cardiovascular: Negative.   Gastrointestinal:  Positive for heartburn (improving).  Genitourinary: Negative.  Negative for frequency.  Musculoskeletal:  Positive for joint pain.  Skin: Negative.   Neurological: Negative.   Endo/Heme/Allergies: Negative.        Objective:   BP 102/70   Pulse 81   Temp (!) 94.8 F (34.9 C)   Ht 5\' 9"  (1.753 m)  Wt 177 lb 3.2 oz (80.4 kg)   SpO2 96%   BMI 26.17 kg/m   Vitals:   03/23/24 0837  BP: 102/70  Pulse: 81  Temp: (!) 94.8 F (34.9 C)  Height: 5\' 9"  (1.753 m)  Weight: 177 lb 3.2 oz (80.4 kg)  SpO2: 96%  BMI (Calculated): 26.16    Physical Exam Vitals reviewed.  Constitutional:      Appearance: Normal appearance.  HENT:     Head: Normocephalic.     Left Ear: There is no impacted cerumen.     Nose: Nose normal.     Mouth/Throat:     Mouth: Mucous membranes are moist.     Pharynx: No posterior oropharyngeal erythema.  Eyes:     Extraocular Movements: Extraocular movements intact.     Pupils: Pupils are equal, round, and reactive to light.  Cardiovascular:     Rate and Rhythm: Regular rhythm.     Chest Wall: PMI is not  displaced.     Pulses: Normal pulses.     Heart sounds: Normal heart sounds. No murmur heard. Pulmonary:     Effort: Pulmonary effort is normal.     Breath sounds: Normal air entry. No rhonchi or rales.  Abdominal:     General: Abdomen is flat. Bowel sounds are normal. There is no distension.     Palpations: Abdomen is soft. There is no hepatomegaly, splenomegaly or mass.     Tenderness: There is no abdominal tenderness.  Musculoskeletal:     Right shoulder: No tenderness. Decreased range of motion.     Cervical back: Normal range of motion and neck supple.     Right lower leg: No edema.     Left lower leg: No edema.  Skin:    General: Skin is warm and dry.  Neurological:     General: No focal deficit present.     Mental Status: He is alert and oriented to person, place, and time.     Cranial Nerves: No cranial nerve deficit.     Motor: No weakness.  Psychiatric:        Mood and Affect: Mood normal.        Behavior: Behavior normal.      Results for orders placed or performed in visit on 03/23/24  POCT Glucose (CBG)  Result Value Ref Range   POC Glucose 124 (A) 70 - 99 mg/dl    Recent Results (from the past 2160 hours)  Hemoglobin A1c     Status: Abnormal   Collection Time: 03/21/24  8:59 AM  Result Value Ref Range   Hgb A1c MFr Bld 7.4 (H) 4.8 - 5.6 %    Comment:          Prediabetes: 5.7 - 6.4          Diabetes: >6.4          Glycemic control for adults with diabetes: <7.0    Est. average glucose Bld gHb Est-mCnc 166 mg/dL  TSH     Status: None   Collection Time: 03/21/24  8:59 AM  Result Value Ref Range   TSH 1.420 0.450 - 4.500 uIU/mL  CMP14+EGFR     Status: Abnormal   Collection Time: 03/21/24  8:59 AM  Result Value Ref Range   Glucose 125 (H) 70 - 99 mg/dL   BUN 16 6 - 24 mg/dL   Creatinine, Ser 1.61 0.76 - 1.27 mg/dL   eGFR 096 >04 VW/UJW/1.19   BUN/Creatinine Ratio 17 9 -  20   Sodium 140 134 - 144 mmol/L   Potassium 4.4 3.5 - 5.2 mmol/L   Chloride  104 96 - 106 mmol/L   CO2 22 20 - 29 mmol/L   Calcium 8.9 8.7 - 10.2 mg/dL   Total Protein 6.2 6.0 - 8.5 g/dL   Albumin 4.4 4.1 - 5.1 g/dL   Globulin, Total 1.8 1.5 - 4.5 g/dL   Bilirubin Total 0.6 0.0 - 1.2 mg/dL   Alkaline Phosphatase 79 44 - 121 IU/L   AST 15 0 - 40 IU/L   ALT 26 0 - 44 IU/L  Lipid panel     Status: None   Collection Time: 03/21/24  8:59 AM  Result Value Ref Range   Cholesterol, Total 160 100 - 199 mg/dL   Triglycerides 82 0 - 149 mg/dL   HDL 55 >16 mg/dL   VLDL Cholesterol Cal 15 5 - 40 mg/dL   LDL Chol Calc (NIH) 90 0 - 99 mg/dL   Chol/HDL Ratio 2.9 0.0 - 5.0 ratio    Comment:                                   T. Chol/HDL Ratio                                             Men  Women                               1/2 Avg.Risk  3.4    3.3                                   Avg.Risk  5.0    4.4                                2X Avg.Risk  9.6    7.1                                3X Avg.Risk 23.4   11.0   POCT Glucose (CBG)     Status: Abnormal   Collection Time: 03/23/24  8:43 AM  Result Value Ref Range   POC Glucose 124 (A) 70 - 99 mg/dl      Assessment & Plan:  As per problem list. Patient instructed to comply with medications as prescribed and request samples if necessary. Problem List Items Addressed This Visit       Endocrine   Type 2 diabetes mellitus without complication, without long-term current use of insulin  (HCC) - Primary   Relevant Orders   POCT Glucose (CBG) (Completed)    Return in about 3 months (around 06/22/2024) for fu with labs prior.   Total time spent: 20 minutes  Arzella Bitters, MD  03/23/2024   This document may have been prepared by [facility] Voice Recognition software and as such may include unintentional dictation errors.

## 2024-05-15 ENCOUNTER — Other Ambulatory Visit: Payer: Self-pay | Admitting: Internal Medicine

## 2024-06-20 ENCOUNTER — Other Ambulatory Visit

## 2024-06-20 DIAGNOSIS — E119 Type 2 diabetes mellitus without complications: Secondary | ICD-10-CM

## 2024-06-20 DIAGNOSIS — E785 Hyperlipidemia, unspecified: Secondary | ICD-10-CM

## 2024-06-20 DIAGNOSIS — I10 Essential (primary) hypertension: Secondary | ICD-10-CM

## 2024-06-21 LAB — CMP14+EGFR
ALT: 19 IU/L (ref 0–44)
AST: 16 IU/L (ref 0–40)
Albumin: 4.7 g/dL (ref 4.1–5.1)
Alkaline Phosphatase: 81 IU/L (ref 44–121)
BUN/Creatinine Ratio: 21 — ABNORMAL HIGH (ref 9–20)
BUN: 19 mg/dL (ref 6–24)
Bilirubin Total: 0.5 mg/dL (ref 0.0–1.2)
CO2: 22 mmol/L (ref 20–29)
Calcium: 9.5 mg/dL (ref 8.7–10.2)
Chloride: 101 mmol/L (ref 96–106)
Creatinine, Ser: 0.91 mg/dL (ref 0.76–1.27)
Globulin, Total: 1.9 g/dL (ref 1.5–4.5)
Glucose: 114 mg/dL — ABNORMAL HIGH (ref 70–99)
Potassium: 4.7 mmol/L (ref 3.5–5.2)
Sodium: 138 mmol/L (ref 134–144)
Total Protein: 6.6 g/dL (ref 6.0–8.5)
eGFR: 108 mL/min/1.73 (ref >59)

## 2024-06-21 LAB — LIPID PANEL
Chol/HDL Ratio: 3.5 ratio (ref 0.0–5.0)
Cholesterol, Total: 155 mg/dL (ref 100–199)
HDL: 44 mg/dL (ref >39)
LDL Chol Calc (NIH): 86 mg/dL (ref 0–99)
Triglycerides: 145 mg/dL (ref 0–149)
VLDL Cholesterol Cal: 25 mg/dL (ref 5–40)

## 2024-06-21 LAB — TSH: TSH: 1.56 u[IU]/mL (ref 0.450–4.500)

## 2024-06-21 LAB — HEMOGLOBIN A1C
Est. average glucose Bld gHb Est-mCnc: 151 mg/dL
Hgb A1c MFr Bld: 6.9 % — ABNORMAL HIGH (ref 4.8–5.6)

## 2024-06-22 ENCOUNTER — Ambulatory Visit: Payer: Self-pay | Admitting: Internal Medicine

## 2024-06-22 ENCOUNTER — Encounter: Payer: Self-pay | Admitting: Internal Medicine

## 2024-06-22 ENCOUNTER — Ambulatory Visit: Admitting: Internal Medicine

## 2024-06-22 VITALS — BP 114/80 | HR 75 | Temp 96.1°F | Ht 69.0 in | Wt 177.0 lb

## 2024-06-22 DIAGNOSIS — E119 Type 2 diabetes mellitus without complications: Secondary | ICD-10-CM | POA: Diagnosis not present

## 2024-06-22 DIAGNOSIS — J301 Allergic rhinitis due to pollen: Secondary | ICD-10-CM | POA: Diagnosis not present

## 2024-06-22 DIAGNOSIS — K219 Gastro-esophageal reflux disease without esophagitis: Secondary | ICD-10-CM | POA: Diagnosis not present

## 2024-06-22 DIAGNOSIS — K760 Fatty (change of) liver, not elsewhere classified: Secondary | ICD-10-CM

## 2024-06-22 DIAGNOSIS — Z0130 Encounter for examination of blood pressure without abnormal findings: Secondary | ICD-10-CM

## 2024-06-22 LAB — GLUCOSE, POCT (MANUAL RESULT ENTRY): POC Glucose: 117 mg/dL — AB (ref 70–99)

## 2024-06-22 LAB — POC CREATINE & ALBUMIN,URINE
Albumin/Creatinine Ratio, Urine, POC: <30
Creatinine, POC: 50 mg/dL
Microalbumin Ur, POC: 10 mg/L

## 2024-06-22 MED ORDER — FARXIGA 10 MG PO TABS
10.0000 mg | ORAL_TABLET | Freq: Every morning | ORAL | 0 refills | Status: DC
Start: 2024-06-22 — End: 2024-09-28

## 2024-06-22 MED ORDER — PIOGLITAZONE HCL 45 MG PO TABS: 45.0000 mg | ORAL_TABLET | Freq: Every day | ORAL | 1 refills | Status: DC

## 2024-06-22 MED ORDER — METFORMIN HCL ER 500 MG PO TB24: 1000.0000 mg | ORAL_TABLET | Freq: Two times a day (BID) | ORAL | 0 refills | Status: DC

## 2024-06-22 NOTE — Progress Notes (Signed)
 Patient notified

## 2024-06-22 NOTE — Progress Notes (Signed)
 Established Patient Office Visit  Subjective:  Patient ID: Sean Anderson, male    DOB: 08/28/1981  Age: 43 y.o. MRN: [id number]  Chief Complaint  Patient presents with   Follow-up    3 month    No new complaints, here for lab review and medication refills. Labs reviewed and notable for well controlled diabetes, A1c now at target, lipids at target with unremarkable cmp. Denies any hypoglycemic episodes and home bg readings have been at target.     No other concerns at this time.   Past Medical History:  Diagnosis Date   Asthma    Diabetes mellitus without complication (HCC)     Past Surgical History:  Procedure Laterality Date   KNEE SURGERY      Social History   Socioeconomic History   Marital status: Married    Spouse name: Not on file   Number of children: 1   Years of education: Not on file   Highest education level: Associate degree: occupational, Scientist, product/process development, or vocational program  Occupational History   Occupation: Programmer, systems  Tobacco Use   Smoking status: Never   Smokeless tobacco: Never  Vaping Use   Vaping status: Never Used  Substance and Sexual Activity   Alcohol use: Yes    Alcohol/week: 4.0 standard drinks of alcohol    Types: 4 Cans of beer per week   Drug use: No   Sexual activity: Yes  Other Topics Concern   Not on file  Social History Narrative   Not on file   Social Drivers of Health   Financial Resource Strain: Not on file  Food Insecurity: Not on file  Transportation Needs: Not on file  Physical Activity: Not on file  Stress: Not on file  Social Connections: Not on file  Intimate Partner Violence: Not on file    Family History  Problem Relation Age of Onset   AAA (abdominal aortic aneurysm) Father    Stroke Father    Colon cancer Other     Allergies  Allergen Reactions   Banana    Penicillins Hives   Pineapple     Outpatient Medications Prior to Visit  Medication Sig   sildenafil  (VIAGRA ) 100 MG tablet Take 0.5-1  tablets (50-100 mg total) by mouth daily as needed.   [DISCONTINUED] FARXIGA  10 MG TABS tablet Take 1 tablet (10 mg total) by mouth every morning.   [DISCONTINUED] metFORMIN  (GLUCOPHAGE -XR) 500 MG 24 hr tablet Take 2 tablets by mouth twice daily   [DISCONTINUED] pioglitazone  (ACTOS ) 45 MG tablet Take 1 tablet (45 mg total) by mouth daily.   pantoprazole  (PROTONIX ) 40 MG tablet Take 1 tablet (40 mg total) by mouth daily. (Patient not taking: Reported on 06/22/2024)   [DISCONTINUED] metFORMIN  (GLUCOPHAGE -XR) 750 MG 24 hr tablet Take 2 tabs in the am and 1 in the pm (Patient not taking: Reported on 06/22/2024)   [DISCONTINUED] ondansetron  (ZOFRAN  ODT) 4 MG disintegrating tablet Take 1 tablet (4 mg total) by mouth every 8 (eight) hours as needed for nausea or vomiting. (Patient not taking: Reported on 06/22/2024)   No facility-administered medications prior to visit.    Review of Systems  Constitutional:  Negative for weight loss (gained 3 lbs).  Eyes: Negative.   Respiratory: Negative.    Cardiovascular: Negative.   Gastrointestinal:  Positive for heartburn (improving).  Genitourinary: Negative.  Negative for frequency.  Musculoskeletal:  Positive for joint pain.  Skin: Negative.   Neurological: Negative.   Endo/Heme/Allergies: Negative.  Objective:   BP 114/80   Pulse 75   Temp (!) 96.1 F (35.6 C)   Ht 5' 9 (1.753 m)   Wt 177 lb (80.3 kg)   SpO2 97%   BMI 26.14 kg/m   Vitals:   06/22/24 0849  BP: 114/80  Pulse: 75  Temp: (!) 96.1 F (35.6 C)  Height: 5' 9 (1.753 m)  Weight: 177 lb (80.3 kg)  SpO2: 97%  BMI (Calculated): 26.13    Physical Exam Vitals reviewed.  Constitutional:      Appearance: Normal appearance.  HENT:     Head: Normocephalic.     Left Ear: There is no impacted cerumen.     Nose: Nose normal.     Mouth/Throat:     Mouth: Mucous membranes are moist.     Pharynx: No posterior oropharyngeal erythema.  Eyes:     Extraocular Movements:  Extraocular movements intact.     Pupils: Pupils are equal, round, and reactive to light.  Cardiovascular:     Rate and Rhythm: Regular rhythm.     Chest Wall: PMI is not displaced.     Pulses: Normal pulses.     Heart sounds: Normal heart sounds. No murmur heard. Pulmonary:     Effort: Pulmonary effort is normal.     Breath sounds: Normal air entry. No rhonchi or rales.  Abdominal:     General: Abdomen is flat. Bowel sounds are normal. There is no distension.     Palpations: Abdomen is soft. There is no hepatomegaly, splenomegaly or mass.     Tenderness: There is no abdominal tenderness.  Musculoskeletal:     Right shoulder: No tenderness. Decreased range of motion.     Cervical back: Normal range of motion and neck supple.     Right lower leg: No edema.     Left lower leg: No edema.  Skin:    General: Skin is warm and dry.  Neurological:     General: No focal deficit present.     Mental Status: He is alert and oriented to person, place, and time.     Cranial Nerves: No cranial nerve deficit.     Motor: No weakness.  Psychiatric:        Mood and Affect: Mood normal.        Behavior: Behavior normal.      Results for orders placed or performed in visit on 06/22/24  POCT Glucose (CBG)  Result Value Ref Range   POC Glucose 117 (A) 70 - 99 mg/dl    Recent Results (from the past 2160 hours)  Lipid panel     Status: None   Collection Time: 06/20/24 10:07 AM  Result Value Ref Range   Cholesterol, Total 155 100 - 199 mg/dL   Triglycerides 854 0 - 149 mg/dL   HDL 44 >60 mg/dL   VLDL Cholesterol Cal 25 5 - 40 mg/dL   LDL Chol Calc (NIH) 86 0 - 99 mg/dL   Chol/HDL Ratio 3.5 0.0 - 5.0 ratio    Comment:                                   T. Chol/HDL Ratio  Men  Women                               1/2 Avg.Risk  3.4    3.3                                   Avg.Risk  5.0    4.4                                2X Avg.Risk  9.6    7.1                                 3X Avg.Risk 23.4   11.0   CMP14+EGFR     Status: Abnormal   Collection Time: 06/20/24 10:07 AM  Result Value Ref Range   Glucose 114 (H) 70 - 99 mg/dL   BUN 19 6 - 24 mg/dL   Creatinine, Ser 9.08 0.76 - 1.27 mg/dL   eGFR 891 >40 fO/fpw/8.26   BUN/Creatinine Ratio 21 (H) 9 - 20   Sodium 138 134 - 144 mmol/L   Potassium 4.7 3.5 - 5.2 mmol/L   Chloride 101 96 - 106 mmol/L   CO2 22 20 - 29 mmol/L   Calcium 9.5 8.7 - 10.2 mg/dL   Total Protein 6.6 6.0 - 8.5 g/dL   Albumin 4.7 4.1 - 5.1 g/dL   Globulin, Total 1.9 1.5 - 4.5 g/dL   Bilirubin Total 0.5 0.0 - 1.2 mg/dL   Alkaline Phosphatase 81 44 - 121 IU/L   AST 16 0 - 40 IU/L   ALT 19 0 - 44 IU/L  TSH     Status: None   Collection Time: 06/20/24 10:07 AM  Result Value Ref Range   TSH 1.560 0.450 - 4.500 uIU/mL  Hemoglobin A1c     Status: Abnormal   Collection Time: 06/20/24 10:07 AM  Result Value Ref Range   Hgb A1c MFr Bld 6.9 (H) 4.8 - 5.6 %    Comment:          Prediabetes: 5.7 - 6.4          Diabetes: >6.4          Glycemic control for adults with diabetes: <7.0    Est. average glucose Bld gHb Est-mCnc 151 mg/dL  POCT Glucose (CBG)     Status: Abnormal   Collection Time: 06/22/24  8:59 AM  Result Value Ref Range   POC Glucose 117 (A) 70 - 99 mg/dl      Assessment & Plan:  As per problem list. The current medical regimen is effective;  continue present plan and medications.  Problem List Items Addressed This Visit       Digestive   Fatty liver disease, nonalcoholic   Relevant Orders   Lipid panel   Comprehensive metabolic panel with GFR   Gastroesophageal reflux disease without esophagitis     Endocrine   Type 2 diabetes mellitus without complication, without long-term current use of insulin  (HCC) - Primary   Relevant Medications   FARXIGA  10 MG TABS tablet   pioglitazone  (ACTOS ) 45 MG tablet   metFORMIN  (GLUCOPHAGE -XR) 500 MG 24 hr tablet   Other Relevant Orders   POCT Glucose (CBG)  (Completed)  Hemoglobin A1c   POC CREATINE & ALBUMIN,URINE   Other Visit Diagnoses       Seasonal allergic rhinitis due to pollen       Relevant Orders   CBC With Diff/Platelet       Return in about 3 months (around 09/22/2024) for cpe with labs prior.   Total time spent: 20 minutes  Sherrill Cinderella Perry, MD  06/22/2024   This document may have been prepared by [facility] Voice Recognition software and as such may include unintentional dictation errors.

## 2024-09-27 ENCOUNTER — Other Ambulatory Visit

## 2024-09-27 DIAGNOSIS — J301 Allergic rhinitis due to pollen: Secondary | ICD-10-CM

## 2024-09-27 DIAGNOSIS — K760 Fatty (change of) liver, not elsewhere classified: Secondary | ICD-10-CM

## 2024-09-27 DIAGNOSIS — E119 Type 2 diabetes mellitus without complications: Secondary | ICD-10-CM

## 2024-09-28 ENCOUNTER — Encounter: Payer: Self-pay | Admitting: Internal Medicine

## 2024-09-28 ENCOUNTER — Ambulatory Visit (INDEPENDENT_AMBULATORY_CARE_PROVIDER_SITE_OTHER): Admitting: Internal Medicine

## 2024-09-28 VITALS — BP 118/72 | HR 97 | Temp 97.2°F | Ht 69.0 in | Wt 185.0 lb

## 2024-09-28 DIAGNOSIS — H34231 Retinal artery branch occlusion, right eye: Secondary | ICD-10-CM | POA: Insufficient documentation

## 2024-09-28 DIAGNOSIS — Z0001 Encounter for general adult medical examination with abnormal findings: Secondary | ICD-10-CM

## 2024-09-28 DIAGNOSIS — E1165 Type 2 diabetes mellitus with hyperglycemia: Secondary | ICD-10-CM | POA: Diagnosis not present

## 2024-09-28 DIAGNOSIS — E7800 Pure hypercholesterolemia, unspecified: Secondary | ICD-10-CM | POA: Diagnosis not present

## 2024-09-28 DIAGNOSIS — K760 Fatty (change of) liver, not elsewhere classified: Secondary | ICD-10-CM

## 2024-09-28 DIAGNOSIS — E119 Type 2 diabetes mellitus without complications: Secondary | ICD-10-CM

## 2024-09-28 LAB — CBC WITH DIFF/PLATELET
Basophils Absolute: 0 x10E3/uL (ref 0.0–0.2)
Basos: 0 %
EOS (ABSOLUTE): 0.3 x10E3/uL (ref 0.0–0.4)
Eos: 6 %
Hematocrit: 53 % — ABNORMAL HIGH (ref 37.5–51.0)
Hemoglobin: 17.7 g/dL (ref 13.0–17.7)
Immature Grans (Abs): 0 x10E3/uL (ref 0.0–0.1)
Immature Granulocytes: 0 %
Lymphocytes Absolute: 1.8 x10E3/uL (ref 0.7–3.1)
Lymphs: 39 %
MCH: 32.6 pg (ref 26.6–33.0)
MCHC: 33.4 g/dL (ref 31.5–35.7)
MCV: 98 fL — ABNORMAL HIGH (ref 79–97)
Monocytes Absolute: 0.3 x10E3/uL (ref 0.1–0.9)
Monocytes: 7 %
Neutrophils Absolute: 2.2 x10E3/uL (ref 1.4–7.0)
Neutrophils: 48 %
Platelets: 192 x10E3/uL (ref 150–450)
RBC: 5.43 x10E6/uL (ref 4.14–5.80)
RDW: 13.1 % (ref 11.6–15.4)
WBC: 4.5 x10E3/uL (ref 3.4–10.8)

## 2024-09-28 LAB — LIPID PANEL
Chol/HDL Ratio: 3.2 ratio (ref 0.0–5.0)
Cholesterol, Total: 187 mg/dL (ref 100–199)
HDL: 58 mg/dL (ref >39)
LDL Chol Calc (NIH): 109 mg/dL — ABNORMAL HIGH (ref 0–99)
Triglycerides: 115 mg/dL (ref 0–149)
VLDL Cholesterol Cal: 20 mg/dL (ref 5–40)

## 2024-09-28 LAB — HEMOGLOBIN A1C
Est. average glucose Bld gHb Est-mCnc: 151 mg/dL
Hgb A1c MFr Bld: 6.9 % — ABNORMAL HIGH (ref 4.8–5.6)

## 2024-09-28 LAB — POCT CBG (FASTING - GLUCOSE)-MANUAL ENTRY: Glucose Fasting, POC: 151 mg/dL — AB (ref 70–99)

## 2024-09-28 MED ORDER — FARXIGA 10 MG PO TABS: 10.0000 mg | ORAL_TABLET | Freq: Every morning | ORAL | 0 refills | Status: DC

## 2024-09-28 MED ORDER — ROSUVASTATIN CALCIUM 5 MG PO TABS: 5.0000 mg | ORAL_TABLET | Freq: Every day | ORAL | 11 refills | Status: DC

## 2024-09-28 NOTE — Progress Notes (Signed)
 Established Patient Office Visit  Subjective:  Patient ID: Sean Anderson, male    DOB: 05/18/1981  Age: 43 y.o. MRN: [id number]  Chief Complaint  Patient presents with   Annual Exam    23mo CPE lab results/discuss eye stroke    C/o acute visual loss in a field of his right eye, went to the ER, MRI negative for tumor. Here for CPE, lab review and medication refills.     No other concerns at this time.   Past Medical History:  Diagnosis Date   Asthma    Diabetes mellitus without complication (HCC)     Past Surgical History:  Procedure Laterality Date   KNEE SURGERY      Social History   Socioeconomic History   Marital status: Married    Spouse name: Not on file   Number of children: 1   Years of education: Not on file   Highest education level: Associate degree: occupational, scientist, product/process development, or vocational program  Occupational History   Occupation: Programmer, systems  Tobacco Use   Smoking status: Never   Smokeless tobacco: Never  Vaping Use   Vaping status: Never Used  Substance and Sexual Activity   Alcohol use: Yes    Alcohol/week: 4.0 standard drinks of alcohol    Types: 4 Cans of beer per week   Drug use: No   Sexual activity: Yes  Other Topics Concern   Not on file  Social History Narrative   Not on file   Social Drivers of Health   Financial Resource Strain: Not on file  Food Insecurity: Not on file  Transportation Needs: Not on file  Physical Activity: Not on file  Stress: Not on file  Social Connections: Not on file  Intimate Partner Violence: Not on file    Family History  Problem Relation Age of Onset   AAA (abdominal aortic aneurysm) Father    Stroke Father    Colon cancer Other     Allergies  Allergen Reactions   Banana    Penicillins Hives   Pineapple     Outpatient Medications Prior to Visit  Medication Sig   FARXIGA  10 MG TABS tablet Take 1 tablet (10 mg total) by mouth every morning.   metFORMIN  (GLUCOPHAGE -XR) 500 MG 24 hr  tablet Take 2 tablets (1,000 mg total) by mouth 2 (two) times daily.   pantoprazole  (PROTONIX ) 40 MG tablet Take 1 tablet (40 mg total) by mouth daily. (Patient not taking: Reported on 06/22/2024)   pioglitazone  (ACTOS ) 45 MG tablet Take 1 tablet (45 mg total) by mouth daily.   sildenafil  (VIAGRA ) 100 MG tablet Take 0.5-1 tablets (50-100 mg total) by mouth daily as needed.   No facility-administered medications prior to visit.    Review of Systems  Eyes:        R partial field loss       Objective:   BP 118/72   Pulse 97   Temp (!) 97.2 F (36.2 C)   Ht 5' 9 (1.753 m)   Wt 185 lb (83.9 kg)   SpO2 96%   BMI 27.32 kg/m   Vitals:   09/28/24 0830  BP: 118/72  Pulse: 97  Temp: (!) 97.2 F (36.2 C)  Height: 5' 9 (1.753 m)  Weight: 185 lb (83.9 kg)  SpO2: 96%  BMI (Calculated): 27.31    Physical Exam Vitals reviewed.  Constitutional:      Appearance: Normal appearance.  HENT:     Head: Normocephalic.  Left Ear: There is no impacted cerumen.     Nose: Nose normal.     Mouth/Throat:     Mouth: Mucous membranes are moist.     Pharynx: No posterior oropharyngeal erythema.  Eyes:     Extraocular Movements: Extraocular movements intact.     Pupils: Pupils are equal, round, and reactive to light.  Cardiovascular:     Rate and Rhythm: Regular rhythm.     Chest Wall: PMI is not displaced.     Pulses: Normal pulses.     Heart sounds: Normal heart sounds. No murmur heard. Pulmonary:     Effort: Pulmonary effort is normal.     Breath sounds: Normal air entry. No rhonchi or rales.  Abdominal:     General: Abdomen is flat. Bowel sounds are normal. There is no distension.     Palpations: Abdomen is soft. There is no hepatomegaly, splenomegaly or mass.     Tenderness: There is no abdominal tenderness.  Musculoskeletal:     Right shoulder: No tenderness. Decreased range of motion.     Cervical back: Normal range of motion and neck supple.     Right lower leg: No edema.      Left lower leg: No edema.  Skin:    General: Skin is warm and dry.  Neurological:     General: No focal deficit present.     Mental Status: He is alert and oriented to person, place, and time.     Cranial Nerves: No cranial nerve deficit.     Motor: No weakness.  Psychiatric:        Mood and Affect: Mood normal.        Behavior: Behavior normal.      Results for orders placed or performed in visit on 09/28/24  POCT CBG (Fasting - Glucose)  Result Value Ref Range   Glucose Fasting, POC 151 (A) 70 - 99 mg/dL    Recent Results (from the past 2160 hours)  CBC With Diff/Platelet     Status: Abnormal   Collection Time: 09/27/24  8:59 AM  Result Value Ref Range   WBC 4.5 3.4 - 10.8 x10E3/uL   RBC 5.43 4.14 - 5.80 x10E6/uL   Hemoglobin 17.7 13.0 - 17.7 g/dL   Hematocrit 46.9 (H) 62.4 - 51.0 %   MCV 98 (H) 79 - 97 fL   MCH 32.6 26.6 - 33.0 pg   MCHC 33.4 31.5 - 35.7 g/dL   RDW 86.8 88.3 - 84.5 %   Platelets 192 150 - 450 x10E3/uL   Neutrophils 48 Not Estab. %   Lymphs 39 Not Estab. %   Monocytes 7 Not Estab. %   Eos 6 Not Estab. %   Basos 0 Not Estab. %   Neutrophils Absolute 2.2 1.4 - 7.0 x10E3/uL   Lymphocytes Absolute 1.8 0.7 - 3.1 x10E3/uL   Monocytes Absolute 0.3 0.1 - 0.9 x10E3/uL   EOS (ABSOLUTE) 0.3 0.0 - 0.4 x10E3/uL   Basophils Absolute 0.0 0.0 - 0.2 x10E3/uL   Immature Granulocytes 0 Not Estab. %   Immature Grans (Abs) 0.0 0.0 - 0.1 x10E3/uL  Lipid panel     Status: Abnormal   Collection Time: 09/27/24  8:59 AM  Result Value Ref Range   Cholesterol, Total 187 100 - 199 mg/dL   Triglycerides 884 0 - 149 mg/dL   HDL 58 >60 mg/dL   VLDL Cholesterol Cal 20 5 - 40 mg/dL   LDL Chol Calc (NIH) 890 (H) 0 - 99  mg/dL   Chol/HDL Ratio 3.2 0.0 - 5.0 ratio    Comment:                                   T. Chol/HDL Ratio                                             Men  Women                               1/2 Avg.Risk  3.4    3.3                                    Avg.Risk  5.0    4.4                                2X Avg.Risk  9.6    7.1                                3X Avg.Risk 23.4   11.0   Hemoglobin A1c     Status: Abnormal   Collection Time: 09/27/24  8:59 AM  Result Value Ref Range   Hgb A1c MFr Bld 6.9 (H) 4.8 - 5.6 %    Comment:          Prediabetes: 5.7 - 6.4          Diabetes: >6.4          Glycemic control for adults with diabetes: <7.0    Est. average glucose Bld gHb Est-mCnc 151 mg/dL  POCT CBG (Fasting - Glucose)     Status: Abnormal   Collection Time: 09/28/24  8:39 AM  Result Value Ref Range   Glucose Fasting, POC 151 (A) 70 - 99 mg/dL      Assessment & Plan:  Jafari was seen today for annual exam.  Type 2 diabetes mellitus with hyperglycemia, without long-term current use of insulin  (HCC) -     POCT CBG (Fasting - Glucose) -     Hemoglobin A1c  Type 2 diabetes mellitus without complication, without long-term current use of insulin  (HCC) -     Hemoglobin A1c  Fatty liver disease, nonalcoholic -     Lipid panel  Pure hypercholesterolemia  Retinal artery occlusion, branch, right  Start Statin to lower LDL to 70 or less for secondary prevention.  Problem List Items Addressed This Visit       Cardiovascular and Mediastinum   Retinal artery occlusion, branch, right     Digestive   Fatty liver disease, nonalcoholic     Endocrine   Type 2 diabetes mellitus without complication, without long-term current use of insulin  (HCC)     Other   Pure hypercholesterolemia   Other Visit Diagnoses       Type 2 diabetes mellitus with hyperglycemia, without long-term current use of insulin  (HCC)    -  Primary   Relevant Orders   POCT CBG (Fasting - Glucose) (Completed)   Hemoglobin A1c       Return in 3  months (on 12/29/2024) for fu with labs prior.   Total time spent: 30 minutes. This time includes review of previous notes and results and patient face to face interaction during today'Dusten Ellinwood visit.    Sherrill Cinderella Perry, MD  09/28/2024   This document may have been prepared by [facility] Voice Recognition software and as such may include unintentional dictation errors.

## 2024-11-07 ENCOUNTER — Other Ambulatory Visit: Payer: Self-pay | Admitting: Internal Medicine

## 2024-11-07 DIAGNOSIS — E119 Type 2 diabetes mellitus without complications: Secondary | ICD-10-CM

## 2024-11-28 ENCOUNTER — Ambulatory Visit: Payer: Self-pay | Admitting: Internal Medicine

## 2024-11-28 ENCOUNTER — Ambulatory Visit: Admitting: Internal Medicine

## 2024-11-28 DIAGNOSIS — R69 Illness, unspecified: Secondary | ICD-10-CM

## 2024-11-28 DIAGNOSIS — J101 Influenza due to other identified influenza virus with other respiratory manifestations: Secondary | ICD-10-CM | POA: Diagnosis not present

## 2024-11-28 DIAGNOSIS — J069 Acute upper respiratory infection, unspecified: Secondary | ICD-10-CM

## 2024-11-28 LAB — POCT XPERT XPRESS SARS COVID-2/FLU/RSV
FLU A: NEGATIVE
FLU B: POSITIVE
RSV RNA, PCR: NEGATIVE
SARS Coronavirus 2: NEGATIVE

## 2024-11-28 MED ORDER — OSELTAMIVIR PHOSPHATE 75 MG PO CAPS: 75.0000 mg | ORAL_CAPSULE | Freq: Two times a day (BID) | ORAL | 0 refills | Status: AC

## 2024-11-28 NOTE — Progress Notes (Signed)
 Virus swab visit for URI

## 2024-11-29 NOTE — Progress Notes (Signed)
 Pt informed

## 2025-01-03 ENCOUNTER — Other Ambulatory Visit

## 2025-01-04 ENCOUNTER — Ambulatory Visit: Admitting: Internal Medicine

## 2025-01-04 ENCOUNTER — Ambulatory Visit: Payer: Self-pay | Admitting: Internal Medicine

## 2025-01-04 VITALS — BP 118/74 | HR 74 | Temp 97.9°F | Ht 69.0 in | Wt 180.4 lb

## 2025-01-04 DIAGNOSIS — H3411 Central retinal artery occlusion, right eye: Secondary | ICD-10-CM | POA: Insufficient documentation

## 2025-01-04 DIAGNOSIS — E7800 Pure hypercholesterolemia, unspecified: Secondary | ICD-10-CM

## 2025-01-04 DIAGNOSIS — K760 Fatty (change of) liver, not elsewhere classified: Secondary | ICD-10-CM

## 2025-01-04 DIAGNOSIS — I679 Cerebrovascular disease, unspecified: Secondary | ICD-10-CM | POA: Insufficient documentation

## 2025-01-04 DIAGNOSIS — Z0130 Encounter for examination of blood pressure without abnormal findings: Secondary | ICD-10-CM

## 2025-01-04 DIAGNOSIS — E1165 Type 2 diabetes mellitus with hyperglycemia: Secondary | ICD-10-CM | POA: Diagnosis not present

## 2025-01-04 DIAGNOSIS — E119 Type 2 diabetes mellitus without complications: Secondary | ICD-10-CM

## 2025-01-04 LAB — POC CREATINE & ALBUMIN,URINE
Albumin/Creatinine Ratio, Urine, POC: <30
Creatinine, POC: 50 mg/dL
Microalbumin Ur, POC: 10 mg/L

## 2025-01-04 LAB — HEMOGLOBIN A1C
Est. average glucose Bld gHb Est-mCnc: 151 mg/dL
Hgb A1c MFr Bld: 6.9 % — ABNORMAL HIGH (ref 4.8–5.6)

## 2025-01-04 LAB — LIPID PANEL
Chol/HDL Ratio: 4.2 ratio (ref 0.0–5.0)
Cholesterol, Total: 225 mg/dL — ABNORMAL HIGH (ref 100–199)
HDL: 54 mg/dL
LDL Chol Calc (NIH): 145 mg/dL — ABNORMAL HIGH (ref 0–99)
Triglycerides: 143 mg/dL (ref 0–149)
VLDL Cholesterol Cal: 26 mg/dL (ref 5–40)

## 2025-01-04 LAB — POCT CBG (FASTING - GLUCOSE)-MANUAL ENTRY: Glucose Fasting, POC: 143 mg/dL — AB (ref 70–99)

## 2025-01-04 MED ORDER — FARXIGA 10 MG PO TABS: 10.0000 mg | ORAL_TABLET | Freq: Every morning | ORAL | 0 refills | Status: AC

## 2025-01-04 MED ORDER — METFORMIN HCL ER 500 MG PO TB24: 1000.0000 mg | ORAL_TABLET | Freq: Two times a day (BID) | ORAL | 0 refills | Status: AC

## 2025-01-04 MED ORDER — PIOGLITAZONE HCL 45 MG PO TABS: 45.0000 mg | ORAL_TABLET | Freq: Every day | ORAL | 1 refills | Status: AC

## 2025-01-04 MED ORDER — REPATHA SURECLICK 140 MG/ML ~~LOC~~ SOAJ: 140.0000 mg | SUBCUTANEOUS | 2 refills | Status: AC

## 2025-01-04 NOTE — Progress Notes (Signed)
 "  Established Patient Office Visit  Subjective:  Patient ID: Sean Anderson, male    DOB: [birth year]  Age: 44 y.o. MRN: [id number]  Chief Complaint  Patient presents with   Follow-up    3 month follow up lab results    No new complaints, here for lab review and medication refills. Labs reviewed and notable for well controlled diabetes, A1c at target, lipids not at target as he complains of intractable myalgia with statin.  Denies any hypoglycemic episodes and home bg readings have been at target.     No other concerns at this time.   Past Medical History:  Diagnosis Date   Asthma    Diabetes mellitus without complication (HCC)     Past Surgical History:  Procedure Laterality Date   KNEE SURGERY      Social History   Socioeconomic History   Marital status: Married    Spouse name: Not on file   Number of children: 1   Years of education: Not on file   Highest education level: Associate degree: occupational, scientist, product/process development, or vocational program  Occupational History   Occupation: Programmer, systems  Tobacco Use   Smoking status: Never   Smokeless tobacco: Never  Vaping Use   Vaping status: Never Used  Substance and Sexual Activity   Alcohol use: Yes    Alcohol/week: 4.0 standard drinks of alcohol    Types: 4 Cans of beer per week   Drug use: No   Sexual activity: Yes  Other Topics Concern   Not on file  Social History Narrative   Not on file   Social Drivers of Health   Tobacco Use: Low Risk (09/28/2024)   Patient History    Smoking Tobacco Use: Never    Smokeless Tobacco Use: Never    Passive Exposure: Not on file  Financial Resource Strain: Not on file  Food Insecurity: Not on file  Transportation Needs: Not on file  Physical Activity: Not on file  Stress: Not on file  Social Connections: Not on file  Intimate Partner Violence: Not on file  Depression (PHQ2-9): Low Risk (07/02/2023)   Depression (PHQ2-9)    PHQ-2 Score: 2  Alcohol Screen: Not on file   Housing: Not on file  Utilities: Not on file  Health Literacy: Not on file    Family History  Problem Relation Age of Onset   AAA (abdominal aortic aneurysm) Father    Stroke Father    Colon cancer Other     Allergies[1]  Show/hide medication list[2]  Review of Systems  Constitutional:  Positive for weight loss (5 lbs).  HENT:  Positive for congestion.   Eyes: Negative.   Respiratory: Negative.    Cardiovascular: Negative.   Gastrointestinal:  Positive for heartburn (improving).  Genitourinary: Negative.  Negative for frequency.  Musculoskeletal:  Positive for joint pain and myalgias.  Skin: Negative.   Neurological: Negative.   Endo/Heme/Allergies: Negative.        Objective:   BP 118/74   Pulse 74   Temp 97.9 F (36.6 C)   Ht 5' 9 (1.753 m)   Wt 180 lb 6.4 oz (81.8 kg)   SpO2 96%   BMI 26.64 kg/m   Vitals:   01/04/25 0836  BP: 118/74  Pulse: 74  Temp: 97.9 F (36.6 C)  Height: 5' 9 (1.753 m)  Weight: 180 lb 6.4 oz (81.8 kg)  SpO2: 96%  BMI (Calculated): 26.63    Physical Exam Vitals reviewed.  Constitutional:  Appearance: Normal appearance.  HENT:     Head: Normocephalic.     Left Ear: There is no impacted cerumen.     Nose: Nose normal.     Mouth/Throat:     Mouth: Mucous membranes are moist.     Pharynx: No posterior oropharyngeal erythema.  Eyes:     Extraocular Movements: Extraocular movements intact.     Pupils: Pupils are equal, round, and reactive to light.  Cardiovascular:     Rate and Rhythm: Regular rhythm.     Chest Wall: PMI is not displaced.     Pulses: Normal pulses.     Heart sounds: Normal heart sounds. No murmur heard. Pulmonary:     Effort: Pulmonary effort is normal.     Breath sounds: Normal air entry. No rhonchi or rales.  Abdominal:     General: Abdomen is flat. Bowel sounds are normal. There is no distension.     Palpations: Abdomen is soft. There is no hepatomegaly, splenomegaly or mass.     Tenderness:  There is no abdominal tenderness.  Musculoskeletal:     Right shoulder: No tenderness. Decreased range of motion.     Cervical back: Normal range of motion and neck supple.     Right lower leg: No edema.     Left lower leg: No edema.  Skin:    General: Skin is warm and dry.  Neurological:     General: No focal deficit present.     Mental Status: He is alert and oriented to person, place, and time.     Cranial Nerves: No cranial nerve deficit.     Motor: No weakness.  Psychiatric:        Mood and Affect: Mood normal.        Behavior: Behavior normal.      Results for orders placed or performed in visit on 01/04/25  POCT CBG (Fasting - Glucose)  Result Value Ref Range   Glucose Fasting, POC 143 (A) 70 - 99 mg/dL    Recent Results (from the past 2160 hours)  POCT XPERT XPRESS SARS COVID-2/FLU/RSV [[id number]]     Status: Abnormal   Collection Time: 11/28/24 10:59 AM  Result Value Ref Range   SARS Coronavirus 2 Negative    FLU A Negative    FLU B Positive    RSV RNA, PCR Negative   Lipid panel     Status: Abnormal   Collection Time: 01/03/25 10:16 AM  Result Value Ref Range   Cholesterol, Total 225 (H) 100 - 199 mg/dL   Triglycerides 856 0 - 149 mg/dL   HDL 54 >60 mg/dL   VLDL Cholesterol Cal 26 5 - 40 mg/dL   LDL Chol Calc (NIH) 854 (H) 0 - 99 mg/dL   Chol/HDL Ratio 4.2 0.0 - 5.0 ratio    Comment:                                   T. Chol/HDL Ratio                                             Men  Women                               1/2  Avg.Risk  3.4    3.3                                   Avg.Risk  5.0    4.4                                2X Avg.Risk  9.6    7.1                                3X Avg.Risk 23.4   11.0   Hemoglobin A1c     Status: Abnormal   Collection Time: 01/03/25 10:16 AM  Result Value Ref Range   Hgb A1c MFr Bld 6.9 (H) 4.8 - 5.6 %    Comment:          Prediabetes: 5.7 - 6.4          Diabetes: >6.4          Glycemic control for adults with  diabetes: <7.0    Est. average glucose Bld gHb Est-mCnc 151 mg/dL  POCT CBG (Fasting - Glucose)     Status: Abnormal   Collection Time: 01/04/25  8:41 AM  Result Value Ref Range   Glucose Fasting, POC 143 (A) 70 - 99 mg/dL      Assessment & Plan:  Sean Anderson was seen today for follow-up.  Type 2 diabetes mellitus without complication, without long-term current use of insulin  (HCC) -     POCT CBG (Fasting - Glucose) -     Farxiga ; Take 1 tablet (10 mg total) by mouth every morning.  Dispense: 90 tablet; Refill: 0 -     metFORMIN  HCl ER; Take 2 tablets (1,000 mg total) by mouth 2 (two) times daily.  Dispense: 360 tablet; Refill: 0 -     Pioglitazone  HCl; Take 1 tablet (45 mg total) by mouth daily.  Dispense: 90 each; Refill: 1 -     POC CREATINE & ALBUMIN,URINE -     Hemoglobin A1c  Pure hypercholesterolemia -     Repatha  SureClick; Inject 140 mg into the skin every 14 (fourteen) days.  Dispense: 2 mL; Refill: 2  Central retinal artery occlusion of right eye  Cerebrovascular disease -     Repatha  SureClick; Inject 140 mg into the skin every 14 (fourteen) days.  Dispense: 2 mL; Refill: 2 -     Comprehensive metabolic panel with GFR  Fatty liver disease, nonalcoholic -     Lipid panel    Problem List Items Addressed This Visit       Cardiovascular and Mediastinum   Central retinal artery occlusion of right eye   Relevant Medications   Evolocumab  (REPATHA  SURECLICK) 140 MG/ML SOAJ   Cerebrovascular disease   Relevant Medications   Evolocumab  (REPATHA  SURECLICK) 140 MG/ML SOAJ   Other Relevant Orders   Comprehensive metabolic panel with GFR     Digestive   Fatty liver disease, nonalcoholic     Endocrine   Type 2 diabetes mellitus without complication, without long-term current use of insulin  (HCC) - Primary   Relevant Medications   FARXIGA  10 MG TABS tablet   metFORMIN  (GLUCOPHAGE -XR) 500 MG 24 hr tablet   pioglitazone  (ACTOS ) 45 MG tablet   Other Relevant Orders   POCT  CBG (Fasting - Glucose) (Completed)  POC CREATINE & ALBUMIN,URINE     Other   Pure hypercholesterolemia   Relevant Medications   Evolocumab  (REPATHA  SURECLICK) 140 MG/ML SOAJ    Return in about 3 months (around 04/03/2025) for fu with labs prior.   Total time spent: 20 minutes. This time includes review of previous notes and results and patient face to face interaction during today'Jkwon Treptow visit.    Sherrill Cinderella Perry, MD  01/04/2025   This document may have been prepared by [facility] Voice Recognition software and as such may include unintentional dictation errors.     [1]  Allergies Allergen Reactions   Banana    Penicillins Hives   Pineapple   [2]  Outpatient Medications Prior to Visit  Medication Sig Note   [DISCONTINUED] FARXIGA  10 MG TABS tablet Take 1 tablet (10 mg total) by mouth every morning.    [DISCONTINUED] metFORMIN  (GLUCOPHAGE -XR) 500 MG 24 hr tablet Take 2 tablets by mouth twice daily    [DISCONTINUED] pioglitazone  (ACTOS ) 45 MG tablet Take 1 tablet (45 mg total) by mouth daily.    [DISCONTINUED] pantoprazole  (PROTONIX ) 40 MG tablet Take 1 tablet (40 mg total) by mouth daily. (Patient not taking: Reported on 01/04/2025)    [DISCONTINUED] rosuvastatin  (CRESTOR ) 5 MG tablet Take 1 tablet (5 mg total) by mouth daily. (Patient not taking: Reported on 01/04/2025) 01/04/2025: Intolerant   [DISCONTINUED] sildenafil  (VIAGRA ) 100 MG tablet Take 0.5-1 tablets (50-100 mg total) by mouth daily as needed. (Patient not taking: Reported on 01/04/2025)    No facility-administered medications prior to visit.   "

## 2025-04-05 ENCOUNTER — Ambulatory Visit: Admitting: Internal Medicine
